# Patient Record
Sex: Female | Born: 1935 | Race: White | Hispanic: No | Smoking: Never smoker
Health system: Southern US, Community
[De-identification: ages and names within clinical notes are randomized; demographics above are authoritative.]

## PROBLEM LIST (undated history)

## (undated) DIAGNOSIS — M1990 Unspecified osteoarthritis, unspecified site: Secondary | ICD-10-CM

## (undated) DIAGNOSIS — I38 Endocarditis, valve unspecified: Secondary | ICD-10-CM

## (undated) DIAGNOSIS — D649 Anemia, unspecified: Secondary | ICD-10-CM

## (undated) DIAGNOSIS — Z9989 Dependence on other enabling machines and devices: Secondary | ICD-10-CM

## (undated) DIAGNOSIS — R42 Dizziness and giddiness: Secondary | ICD-10-CM

## (undated) DIAGNOSIS — I10 Essential (primary) hypertension: Secondary | ICD-10-CM

## (undated) DIAGNOSIS — I350 Nonrheumatic aortic (valve) stenosis: Secondary | ICD-10-CM

## (undated) DIAGNOSIS — Z972 Presence of dental prosthetic device (complete) (partial): Secondary | ICD-10-CM

## (undated) DIAGNOSIS — M109 Gout, unspecified: Secondary | ICD-10-CM

## (undated) DIAGNOSIS — I5030 Unspecified diastolic (congestive) heart failure: Secondary | ICD-10-CM

## (undated) DIAGNOSIS — Z923 Personal history of irradiation: Secondary | ICD-10-CM

## (undated) DIAGNOSIS — E785 Hyperlipidemia, unspecified: Secondary | ICD-10-CM

## (undated) DIAGNOSIS — M810 Age-related osteoporosis without current pathological fracture: Secondary | ICD-10-CM

## (undated) DIAGNOSIS — I4891 Unspecified atrial fibrillation: Secondary | ICD-10-CM

## (undated) DIAGNOSIS — C50919 Malignant neoplasm of unspecified site of unspecified female breast: Secondary | ICD-10-CM

## (undated) HISTORY — PX: JOINT REPLACEMENT: SHX530

## (undated) HISTORY — PX: BREAST SURGERY: SHX581

## (undated) HISTORY — PX: EYE SURGERY: SHX253

---

## 1987-04-26 HISTORY — PX: BREAST LUMPECTOMY: SHX2

## 1996-01-24 HISTORY — PX: JOINT REPLACEMENT: SHX530

## 2004-03-25 ENCOUNTER — Ambulatory Visit: Payer: Self-pay | Admitting: Internal Medicine

## 2005-05-09 ENCOUNTER — Ambulatory Visit: Payer: Self-pay | Admitting: Internal Medicine

## 2006-05-29 ENCOUNTER — Ambulatory Visit: Payer: Self-pay | Admitting: Internal Medicine

## 2007-06-04 ENCOUNTER — Ambulatory Visit: Payer: Self-pay | Admitting: Internal Medicine

## 2008-04-24 ENCOUNTER — Inpatient Hospital Stay: Payer: Self-pay | Admitting: Internal Medicine

## 2008-06-23 ENCOUNTER — Ambulatory Visit: Payer: Self-pay | Admitting: Internal Medicine

## 2009-02-11 ENCOUNTER — Ambulatory Visit: Payer: Self-pay | Admitting: Internal Medicine

## 2009-07-02 ENCOUNTER — Ambulatory Visit: Payer: Self-pay | Admitting: Internal Medicine

## 2010-04-28 ENCOUNTER — Emergency Department: Payer: Self-pay | Admitting: Emergency Medicine

## 2010-07-13 ENCOUNTER — Ambulatory Visit: Payer: Self-pay | Admitting: Internal Medicine

## 2011-08-23 ENCOUNTER — Ambulatory Visit: Payer: Self-pay | Admitting: Internal Medicine

## 2012-08-29 ENCOUNTER — Ambulatory Visit: Payer: Self-pay | Admitting: Internal Medicine

## 2013-08-30 ENCOUNTER — Ambulatory Visit: Payer: Self-pay | Admitting: Internal Medicine

## 2014-08-08 ENCOUNTER — Other Ambulatory Visit: Payer: Self-pay | Admitting: Internal Medicine

## 2014-08-08 DIAGNOSIS — Z1231 Encounter for screening mammogram for malignant neoplasm of breast: Secondary | ICD-10-CM

## 2014-09-02 ENCOUNTER — Ambulatory Visit
Admission: RE | Admit: 2014-09-02 | Discharge: 2014-09-02 | Disposition: A | Discharge status: Home / Self Care | Payer: Medicare Other | Source: Ambulatory Visit | Attending: Internal Medicine | Admitting: Internal Medicine

## 2014-09-02 DIAGNOSIS — Z853 Personal history of malignant neoplasm of breast: Secondary | ICD-10-CM | POA: Insufficient documentation

## 2014-09-02 DIAGNOSIS — Z9889 Other specified postprocedural states: Secondary | ICD-10-CM | POA: Diagnosis not present

## 2014-09-02 DIAGNOSIS — Z1231 Encounter for screening mammogram for malignant neoplasm of breast: Secondary | ICD-10-CM | POA: Diagnosis not present

## 2014-09-02 HISTORY — DX: Malignant neoplasm of unspecified site of unspecified female breast: C50.919

## 2015-07-13 ENCOUNTER — Other Ambulatory Visit: Payer: Self-pay | Admitting: Internal Medicine

## 2015-07-13 DIAGNOSIS — Z1231 Encounter for screening mammogram for malignant neoplasm of breast: Secondary | ICD-10-CM

## 2015-09-03 ENCOUNTER — Ambulatory Visit
Admission: RE | Admit: 2015-09-03 | Discharge: 2015-09-03 | Disposition: A | Discharge status: Home / Self Care | Payer: Medicare Other | Source: Ambulatory Visit | Attending: Internal Medicine | Admitting: Internal Medicine

## 2015-09-03 DIAGNOSIS — Z1231 Encounter for screening mammogram for malignant neoplasm of breast: Secondary | ICD-10-CM | POA: Diagnosis not present

## 2015-10-28 ENCOUNTER — Encounter: Payer: Self-pay | Admitting: *Deleted

## 2015-10-28 ENCOUNTER — Ambulatory Visit
Admission: EM | Admit: 2015-10-28 | Discharge: 2015-10-28 | Disposition: A | Discharge status: Home / Self Care | Payer: Medicare Other | Attending: Family Medicine | Admitting: Family Medicine

## 2015-10-28 DIAGNOSIS — E785 Hyperlipidemia, unspecified: Secondary | ICD-10-CM | POA: Insufficient documentation

## 2015-10-28 DIAGNOSIS — M810 Age-related osteoporosis without current pathological fracture: Secondary | ICD-10-CM | POA: Insufficient documentation

## 2015-10-28 DIAGNOSIS — S39012A Strain of muscle, fascia and tendon of lower back, initial encounter: Secondary | ICD-10-CM

## 2015-10-28 DIAGNOSIS — Z853 Personal history of malignant neoplasm of breast: Secondary | ICD-10-CM | POA: Insufficient documentation

## 2015-10-28 DIAGNOSIS — M109 Gout, unspecified: Secondary | ICD-10-CM | POA: Diagnosis not present

## 2015-10-28 DIAGNOSIS — M549 Dorsalgia, unspecified: Secondary | ICD-10-CM | POA: Insufficient documentation

## 2015-10-28 DIAGNOSIS — I10 Essential (primary) hypertension: Secondary | ICD-10-CM | POA: Insufficient documentation

## 2015-10-28 DIAGNOSIS — D649 Anemia, unspecified: Secondary | ICD-10-CM | POA: Diagnosis not present

## 2015-10-28 HISTORY — DX: Endocarditis, valve unspecified: I38

## 2015-10-28 HISTORY — DX: Anemia, unspecified: D64.9

## 2015-10-28 HISTORY — DX: Essential (primary) hypertension: I10

## 2015-10-28 HISTORY — DX: Unspecified osteoarthritis, unspecified site: M19.90

## 2015-10-28 HISTORY — DX: Age-related osteoporosis without current pathological fracture: M81.0

## 2015-10-28 HISTORY — DX: Hyperlipidemia, unspecified: E78.5

## 2015-10-28 HISTORY — DX: Gout, unspecified: M10.9

## 2015-10-28 LAB — URINALYSIS COMPLETE WITH MICROSCOPIC (ARMC ONLY)
Bacteria, UA: NONE SEEN
Bilirubin Urine: NEGATIVE
Glucose, UA: NEGATIVE mg/dL
HGB URINE DIPSTICK: NEGATIVE
Ketones, ur: NEGATIVE mg/dL
Nitrite: NEGATIVE
PH: 6 (ref 5.0–8.0)
PROTEIN: NEGATIVE mg/dL
RBC / HPF: NONE SEEN RBC/hpf (ref 0–5)
SPECIFIC GRAVITY, URINE: 1.015 (ref 1.005–1.030)

## 2015-10-28 NOTE — ED Provider Notes (Signed)
CSN: [id number]     Arrival date & time 10/28/15  1436 History   First MD Initiated Contact with Patient 10/28/15 1519     Chief Complaint  Patient presents with  . Back Pain   (Consider location/radiation/quality/duration/timing/severity/associated sxs/prior Treatment) [name] [name] presents today with symptoms of right mid back pain for the last week. Patient denies any trauma or injury. She denies any fall. She denies any history of back problems in the past. She thinks her pain is related to a muscular pain. She wanted to make sure her pain was not related to her gallbladder. She denies any abdominal pain, constipation, diarrhea, nausea, vomiting. She denies any chest pain, shortness of breath, urinary symptoms. She is here today with her daughter. Her daughter has not noticed any changes in her behavior or mental status recently. Patient states that the pain is noticeable when she gets up from a seated position or moves in certain directions.  Past Medical History  Diagnosis Date  . Breast cancer ([place])     lt breast ca 1989 lumpectomy  . Hypertension   . Gout   . Osteoporosis   . Anemia   . Arthritis   . Hyperlipemia   . Valvular heart disease    Past Surgical History  Procedure Laterality Date  . Eye surgery    . Joint replacement    . Breast surgery     Family History  Problem Relation Age of Onset  . Hypertension Mother   . Stroke Mother    Social History  Substance Use Topics  . Smoking status: Never Smoker   . Smokeless tobacco: Never Used  . Alcohol Use: No   OB History    No data available     Review of Systems: Negative except mentioned above.  Allergies  Review of patient's allergies indicates no known allergies.  Home Medications   Prior to Admission medications   Medication Sig Start Date End Date Taking? Authorizing Provider  aspirin 81 MG tablet Take 81 mg by mouth daily.   Yes Historical Provider, MD  calcium carbonate (OSCAL) 1500 (600 Ca) MG TABS  tablet Take 1,500 mg by mouth daily with breakfast.   Yes Historical Provider, MD  hydrALAZINE (APRESOLINE) 25 MG tablet Take 25 mg by mouth 2 (two) times daily.   Yes Historical Provider, MD  labetalol (NORMODYNE) 100 MG tablet Take 100 mg by mouth 2 (two) times daily.   Yes Historical Provider, MD  lisinopril-hydrochlorothiazide (PRINZIDE,ZESTORETIC) 20-12.5 MG tablet Take 1 tablet by mouth daily.   Yes Historical Provider, MD  meclizine (ANTIVERT) 25 MG tablet Take 25 mg by mouth 3 (three) times daily as needed for dizziness.   Yes Historical Provider, MD  Multiple Vitamin (MULTIVITAMIN) tablet Take 1 tablet by mouth.   Yes Historical Provider, MD  naproxen sodium (ANAPROX) 220 MG tablet Take 220 mg by mouth 2 (two) times daily with a meal.   Yes Historical Provider, MD   Meds Ordered and Administered this Visit  Medications - No data to display  BP 131/58 mmHg  Pulse 65  Temp(Src) 98.3 F (36.8 C) (Oral)  Resp 18  Ht 5\' 4"  (1.626 m)  Wt 160 lb (72.576 kg)  BMI 27.45 kg/m2  SpO2 97% No data found.   Physical Exam:  GENERAL: NAD, sitting in chair  HEENT: no pharyngeal erythema, no exudate RESP: CTA B CARD: RRR MSK: +kyphosis, no midline tenderness, mild tenderness to right lateral back area reproduced with palpation, extension and right/left side  bending  SKIN: no rash on the torso NEURO: CN II-XII grossly intact   ED Course  Procedures (including critical care time)  Labs Review Labs Reviewed  URINALYSIS COMPLETEWITH MICROSCOPIC ([place])    Imaging Review No results found.    MDM  A/P: Right mid back pain- appears to be likely musculoskeletal, I advised the patient to take Tylenol, NSAIDs to see if she has any difference in her pain. I've asked that she follow up with her primary care physician this week. Her daughter will monitor for any rash that develops in the area that could represent shingles. She will try ice/heat when necessary.     Paulina Fusi,  MD 10/28/15 [phone]

## 2015-10-28 NOTE — ED Notes (Signed)
Patient started having severe right mid back pain for two days. Patient states that she has been having back pain for two weeks. No history of back surgery, but some muscle back pain has occurred.

## 2016-08-22 ENCOUNTER — Other Ambulatory Visit: Payer: Self-pay | Admitting: Internal Medicine

## 2016-08-22 DIAGNOSIS — Z1231 Encounter for screening mammogram for malignant neoplasm of breast: Secondary | ICD-10-CM

## 2016-09-05 ENCOUNTER — Ambulatory Visit
Admission: RE | Admit: 2016-09-05 | Discharge: 2016-09-05 | Disposition: A | Discharge status: Home / Self Care | Payer: Medicare Other | Source: Ambulatory Visit | Attending: Internal Medicine | Admitting: Internal Medicine

## 2016-09-05 DIAGNOSIS — Z1231 Encounter for screening mammogram for malignant neoplasm of breast: Secondary | ICD-10-CM

## 2016-09-05 HISTORY — DX: Personal history of irradiation: Z92.3

## 2017-02-03 ENCOUNTER — Emergency Department: Payer: Medicare Other

## 2017-02-03 ENCOUNTER — Emergency Department
Admission: EM | Admit: 2017-02-03 | Discharge: 2017-02-03 | Disposition: A | Discharge status: Home / Self Care | Payer: Medicare Other | Attending: Emergency Medicine | Admitting: Emergency Medicine

## 2017-02-03 DIAGNOSIS — S51812A Laceration without foreign body of left forearm, initial encounter: Secondary | ICD-10-CM | POA: Insufficient documentation

## 2017-02-03 DIAGNOSIS — T148XXA Other injury of unspecified body region, initial encounter: Secondary | ICD-10-CM

## 2017-02-03 DIAGNOSIS — S41112A Laceration without foreign body of left upper arm, initial encounter: Secondary | ICD-10-CM

## 2017-02-03 DIAGNOSIS — Z79899 Other long term (current) drug therapy: Secondary | ICD-10-CM | POA: Insufficient documentation

## 2017-02-03 DIAGNOSIS — W19XXXA Unspecified fall, initial encounter: Secondary | ICD-10-CM | POA: Insufficient documentation

## 2017-02-03 DIAGNOSIS — Y92019 Unspecified place in single-family (private) house as the place of occurrence of the external cause: Secondary | ICD-10-CM | POA: Insufficient documentation

## 2017-02-03 DIAGNOSIS — Y939 Activity, unspecified: Secondary | ICD-10-CM | POA: Diagnosis not present

## 2017-02-03 DIAGNOSIS — S5012XA Contusion of left forearm, initial encounter: Secondary | ICD-10-CM | POA: Diagnosis not present

## 2017-02-03 DIAGNOSIS — Z853 Personal history of malignant neoplasm of breast: Secondary | ICD-10-CM | POA: Insufficient documentation

## 2017-02-03 DIAGNOSIS — Y999 Unspecified external cause status: Secondary | ICD-10-CM | POA: Insufficient documentation

## 2017-02-03 DIAGNOSIS — I10 Essential (primary) hypertension: Secondary | ICD-10-CM | POA: Insufficient documentation

## 2017-02-03 DIAGNOSIS — Z7982 Long term (current) use of aspirin: Secondary | ICD-10-CM | POA: Diagnosis not present

## 2017-02-03 DIAGNOSIS — D649 Anemia, unspecified: Secondary | ICD-10-CM | POA: Diagnosis not present

## 2017-02-03 DIAGNOSIS — R4182 Altered mental status, unspecified: Secondary | ICD-10-CM | POA: Diagnosis present

## 2017-02-03 LAB — URINALYSIS, COMPLETE (UACMP) WITH MICROSCOPIC
BACTERIA UA: NONE SEEN
Bilirubin Urine: NEGATIVE
Glucose, UA: NEGATIVE mg/dL
HGB URINE DIPSTICK: NEGATIVE
Ketones, ur: 5 mg/dL — AB
Leukocytes, UA: NEGATIVE
NITRITE: NEGATIVE
Protein, ur: NEGATIVE mg/dL
SPECIFIC GRAVITY, URINE: 1.009 (ref 1.005–1.030)
pH: 7 (ref 5.0–8.0)

## 2017-02-03 LAB — TYPE AND SCREEN
ABO/RH(D): O POS
ANTIBODY SCREEN: NEGATIVE

## 2017-02-03 LAB — COMPREHENSIVE METABOLIC PANEL
ALT: 22 U/L (ref 14–54)
ANION GAP: 10 (ref 5–15)
AST: 46 U/L — ABNORMAL HIGH (ref 15–41)
Albumin: 4.1 g/dL (ref 3.5–5.0)
Alkaline Phosphatase: 64 U/L (ref 38–126)
BUN: 17 mg/dL (ref 6–20)
CHLORIDE: 95 mmol/L — AB (ref 101–111)
CO2: 26 mmol/L (ref 22–32)
Calcium: 10.2 mg/dL (ref 8.9–10.3)
Creatinine, Ser: 0.8 mg/dL (ref 0.44–1.00)
GFR, EST NON AFRICAN AMERICAN: 59 mL/min — AB (ref >60)
Glucose, Bld: 116 mg/dL — ABNORMAL HIGH (ref 65–99)
POTASSIUM: 4.1 mmol/L (ref 3.5–5.1)
Sodium: 131 mmol/L — ABNORMAL LOW (ref 135–145)
Total Bilirubin: 0.6 mg/dL (ref 0.3–1.2)
Total Protein: 7.1 g/dL (ref 6.5–8.1)

## 2017-02-03 LAB — CBC
HEMATOCRIT: 32.8 % — AB (ref 35.0–47.0)
Hemoglobin: 11.3 g/dL — ABNORMAL LOW (ref 12.0–16.0)
MCH: 32.6 pg (ref 26.0–34.0)
MCHC: 34.3 g/dL (ref 32.0–36.0)
MCV: 95 fL (ref 80.0–100.0)
Platelets: 322 10*3/uL (ref 150–440)
RBC: 3.46 MIL/uL — ABNORMAL LOW (ref 3.80–5.20)
RDW: 12.6 % (ref 11.5–14.5)
WBC: 16.1 10*3/uL — AB (ref 3.6–11.0)

## 2017-02-03 LAB — PROTIME-INR
INR: 0.94
PROTHROMBIN TIME: 12.5 s (ref 11.4–15.2)

## 2017-02-03 LAB — CK
CK TOTAL: 761 U/L — AB (ref 38–234)
Total CK: 822 U/L — ABNORMAL HIGH (ref 38–234)

## 2017-02-03 LAB — RAPID HIV SCREEN (HIV 1/2 AB+AG)
HIV 1/2 Antibodies: NONREACTIVE
HIV-1 P24 ANTIGEN - HIV24: NONREACTIVE

## 2017-02-03 NOTE — ED Notes (Signed)
Bear hugger applied, family is at the bedside.

## 2017-02-03 NOTE — ED Provider Notes (Addendum)
[facility] Emergency Department Provider Note  Time seen: 10:46 AM  I have reviewed the triage vital signs and the nursing notes.   HISTORY  Chief Complaint Fall and Altered Mental Status    HPI [name] [name] is a 83 y.o. female With a past medical history of anemia, arthritis, hypertension, hyperlipidemia, presents the emergency department after a fall with laceration. According to family 2 days ago the patient was started on prednisone for a skin lesion to her right upper extremity. They state last night she did appear mildly confused. They did not think much of it however. Today they found the patient on the ground where she had apparently fallen around 9 PM yesterday. Patient was covered in blood from a laceration to her left upper extremity. Here the patient is alert, oriented besides time (states 1918 instead of 2018).  patient does take 81 mg aspirin. There unsure if the patient has been taking her medication properly however. Upon arrival overall patient appears well, lying in bed, in no distress. Patient does have a approximate 4 x 4 cm hematoma to the left forearm with an overlying laceration/skin rupture and bleeding.  Past Medical History:  Diagnosis Date  . Anemia   . Arthritis   . Breast cancer ([place])    lt breast ca 1989 lumpectomy  . Gout   . Hyperlipemia   . Hypertension   . Osteoporosis   . Personal history of radiation therapy   . Valvular heart disease     There are no active problems to display for this patient.   Past Surgical History:  Procedure Laterality Date  . BREAST LUMPECTOMY Left 1989  . BREAST SURGERY    . EYE SURGERY    . JOINT REPLACEMENT      Prior to Admission medications   Medication Sig Start Date End Date Taking? Authorizing Provider  aspirin 81 MG tablet Take 81 mg by mouth daily.    [provider]  calcium carbonate (OSCAL) 1500 (600 Ca) MG TABS tablet Take 1,500 mg by mouth daily with breakfast.     [provider]  hydrALAZINE (APRESOLINE) 25 MG tablet Take 25 mg by mouth 2 (two) times daily.    [provider]  labetalol (NORMODYNE) 100 MG tablet Take 100 mg by mouth 2 (two) times daily.    [provider]  lisinopril-hydrochlorothiazide (PRINZIDE,ZESTORETIC) 20-12.5 MG tablet Take 1 tablet by mouth daily.    [provider]  meclizine (ANTIVERT) 25 MG tablet Take 25 mg by mouth 3 (three) times daily as needed for dizziness.    [provider]  Multiple Vitamin (MULTIVITAMIN) tablet Take 1 tablet by mouth.    [provider]  naproxen sodium (ANAPROX) 220 MG tablet Take 220 mg by mouth 2 (two) times daily with a meal.    [provider]    No Known Allergies  Family History  Problem Relation Age of Onset  . Hypertension Mother   . Stroke Mother   . Breast cancer Neg Hx     Social History Social History  Substance Use Topics  . Smoking status: Never Smoker  . Smokeless tobacco: Never Used  . Alcohol use No    Review of Systems Constitutional: positive for fall, unclear if LOC. Cardiovascular: Negative for chest pain. Respiratory: Negative for shortness of breath. Gastrointestinal: Negative for abdominal pain, vomiting Skin: hematoma with laceration to left forearm Neurological: Negative for headaches, focal weakness or numbness. All other ROS negative  ____________________________________________   PHYSICAL EXAM:  VITAL SIGNS: ED Triage Vitals  Enc Vitals Group     BP 02/03/17 1010 (!) 187/108     Pulse Rate 02/03/17 1010 78     Resp 02/03/17 1010 18     Temp 02/03/17 1046 (!) 95.3 F (35.2 C)     Temp Source 02/03/17 1010 Rectal     SpO2 02/03/17 1010 98 %     Weight 02/03/17 1011 150 lb (68 kg)     Height 02/03/17 1011 5\' 5"  (1.651 m)     Head Circumference --      Peak Flow --      Pain Score 02/03/17 1009 8     Pain Loc --      Pain Edu? --      Excl. in [place]? --     Constitutional:  Alert and oriented. Well appearing and in no distress. Eyes: Normal exam ENT   Head: Normocephalic and atraumatic.   Mouth/Throat: Mucous membranes are moist. Cardiovascular: Normal rate, regular rhythm. No murmur Respiratory: Normal respiratory effort without tachypnea nor retractions. Breath sounds are clear  Gastrointestinal: Soft and nontender. No distention.  Musculoskeletal: hematoma with laceration to left upper extremity, otherwise normal. Good range of motion all extremities without pain. Neurologic:  Normal speech and language. No gross focal neurologic deficits Skin:  4 x 4 centimeter hematoma to left upper extremity with approximate 3 cm laceration/rupture overlying. Mild amount of bleeding. Psychiatric: Mood and affect are normal.  ____________________________________________    EKG  EKG reviewed and interpreted by myself shows normal sinus rhythm at 69 bpm with a widened QRS, left axis deviation, largely normal intervals with no concerning ST changes.  ____________________________________________    RADIOLOGY  IMPRESSION: Mild soft tissue swelling/ hematoma overlying the left parietal bone and left lateral zygoma.  No evidence of maxillofacial fracture.  No evidence of acute intracranial abnormality. Small vessel ischemic changes.  No evidence of traumatic injury to the cervical spine. Mild multilevel degenerative changes.  ____________________________________________   INITIAL IMPRESSION / ASSESSMENT AND PLAN / ED COURSE  Pertinent labs & imaging results that were available during my care of the patient were reviewed by me and considered in my medical decision making (see chart for details).  patient presents to the emergency department after a fall last night, unable to get up off the ground over night. Differential this time would include mechanical fall, electrolyte abnormality, infectious etiology, prednisone induced confusion, rhabdomyolysis,  intracranial abnormalities. We will obtain labs including a CK, urinalysis. We will obtain a head CT and continue to closely monitor the patient in the emergency department. Patient is noted to be mildly hypothermic we will place on a bear hugger. The patient's hematoma is approximately 4 x 4 centimeters, well-circumscribed to the left forearm with approximately 3 cm laceration overlying the top of the hematoma. Surgicel was placed over the laceration and [place] and was placed on top as a pressure dressing. Hemostasis has been achieved.  patient's labs show a moderate leukocytosis, mild to moderate creatinine kinase elevation. We will recheck at 2 hour mark.  CT scans are negative for acute abnormality besides and external hematoma.discussed findings with patient. Change the patient's bandage, bleeding remains controlled.  EMS worker was exposed to blood of the patient during the transportation. Paramedic got patient's blood into her mouth. Immediately washed her mouth. Denies any open sores. I reviewed the patient's records, discussed with the paramedic but I believe it is a very low risk  exposure, and she opted not to pursue any further prophylaxis. We will however obtain source exposure labs including HIV hepatitis B and C.  creatinine kinase is declining. I believe the patient is safe for discharge home with PCP follow-up. Daughter is here and is agreeable to plan.I discussed with the daughter monitoring the patient closely at home and discontinuing prednisone. I also discussed wound care changing the dressing once daily.  ____________________________________________   FINAL CLINICAL IMPRESSION(S) / ED DIAGNOSES  fall Hematoma  laceration    Harvest Dark, MD 02/03/17 1408    Harvest Dark, MD 02/03/17 1408

## 2017-02-03 NOTE — Discharge Instructions (Signed)
you have been seen in the emergency department after a fall. You have suffered a hematoma with cut to the left arm. Please keep this covered with a dressing and change this dressing once daily. Please follow-up with your doctor for recheck in the next 2-3 days. Return to the emergency department for any further falls, any significant bleeding, or any other symptom personally concerning to yourself.

## 2017-02-03 NOTE — ED Triage Notes (Addendum)
Pt comes into the ED via EMS from home with c/o AMS with unknown fall. Pt lives alone and normally is a/ox4. Granddaughter states the pt was slightly confused last night and when she checked on her today found her more altered and covered in blood from a wound on the left FA, states she has been on abx and predisone for infection of the right arm. Pt has a hematoma to the left side of face, but is not aware of falling and is a poor historian at present. Pt is alert and has clear speech.. Pt has large hematoma of the left FA with a skin tear.

## 2017-02-17 ENCOUNTER — Encounter: Payer: Medicare Other | Attending: Physician Assistant | Admitting: Physician Assistant

## 2017-02-17 DIAGNOSIS — Z923 Personal history of irradiation: Secondary | ICD-10-CM | POA: Diagnosis not present

## 2017-02-17 DIAGNOSIS — Z853 Personal history of malignant neoplasm of breast: Secondary | ICD-10-CM | POA: Diagnosis not present

## 2017-02-17 DIAGNOSIS — W19XXXA Unspecified fall, initial encounter: Secondary | ICD-10-CM | POA: Diagnosis not present

## 2017-02-17 DIAGNOSIS — M1990 Unspecified osteoarthritis, unspecified site: Secondary | ICD-10-CM | POA: Insufficient documentation

## 2017-02-17 DIAGNOSIS — I10 Essential (primary) hypertension: Secondary | ICD-10-CM | POA: Diagnosis not present

## 2017-02-17 DIAGNOSIS — E785 Hyperlipidemia, unspecified: Secondary | ICD-10-CM | POA: Insufficient documentation

## 2017-02-17 DIAGNOSIS — S51802A Unspecified open wound of left forearm, initial encounter: Secondary | ICD-10-CM | POA: Insufficient documentation
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
## 2017-02-20 NOTE — Progress Notes (Signed)
[name], [name] ([id number]) Visit Report for 02/17/2017 Abuse/Suicide Risk Screen Details Patient Name: [name] [name], [name] Date of Service: 02/17/2017 8:00 AM Medical Record Number: [id number] Patient Account Number: [id number] Date of Birth/Sex: 05/11/1916 (83 y.o. Female) Treating RN: Montey Hora Primary Care Lambros Cerro: Raechel Ache, DAVID Other Clinician: Referring Aina Rossbach: Raechel Ache, DAVID Treating Lerline Valdivia/Extender: Melburn Hake, HOYT Weeks in Treatment: 0 Abuse/Suicide Risk Screen Items Answer ABUSE/SUICIDE RISK SCREEN: Has anyone close to you tried to hurt or harm you recentlyo No Do you feel uncomfortable with anyone in your familyo No Has anyone forced you do things that you didnot want to doo No Do you have any thoughts of harming yourselfo No Patient displays signs or symptoms of abuse and/or neglect. No Electronic Signature(s) Signed: 02/17/2017 4:32:17 PM By: Montey Hora Entered By: Montey Hora on 02/17/2017 08:27:10 Weyandt, Judyann P. ([id number]) -------------------------------------------------------------------------------- Activities of Daily Living Details Patient Name: [name], [name] Date of Service: 02/17/2017 8:00 AM Medical Record Number: [id number] Patient Account Number: [id number] Date of Birth/Sex: 08-18-1916 (83 y.o. Female) Treating RN: Montey Hora Primary Care Jahkeem Kurka: Raechel Ache, DAVID Other Clinician: Referring Calum Cormier: Raechel Ache, DAVID Treating Oceana Walthall/Extender: Melburn Hake, HOYT Weeks in Treatment: 0 Activities of Daily Living Items Answer Activities of Daily Living (Please select one for each item) Drive Automobile Not Able Take Medications Need Assistance Use Telephone Completely Able Care for Appearance Completely Able Use Toilet Completely Able Bath / Shower Completely Able Dress Self Completely Able Feed Self Completely Able Walk Completely Able Get In / Out Bed Completely [place] Need Assistance Shop for Self Need Assistance Electronic Signature(s) Signed: 02/17/2017 4:32:17 PM By: Montey Hora Entered By: Montey Hora on 02/17/2017 08:27:45 Gilliand, Sina P. ([id number]) -------------------------------------------------------------------------------- Education Assessment Details Patient Name: [name], [name] Date of Service: 02/17/2017 8:00 AM Medical Record Number: [id number] Patient Account Number: [id number] Date of Birth/Sex: May 21, 1916 (83 y.o. Female) Treating RN: Montey Hora Primary Care Khloi Rawl: Raechel Ache, DAVID Other Clinician: Referring Hisayo Delossantos: Raechel Ache, DAVID Treating Shellye Zandi/Extender: Melburn Hake, HOYT Weeks in Treatment: 0 Primary Learner Assessed: Caregiver Reason Patient is not Primary Learner: wound location Learning Preferences/Education Level/Primary Language Learning Preference: Explanation, Demonstration Highest Education Level: High School Preferred Language: English Cognitive Barrier Assessment/Beliefs Language Barrier: No Translator Needed: No Memory Deficit: No Emotional Barrier: No Cultural/Religious Beliefs Affecting Medical Care: No Physical Barrier Assessment Impaired Vision: No Impaired Hearing: No Decreased Hand dexterity: No Knowledge/Comprehension Assessment Knowledge Level: Medium Comprehension Level: Medium Ability to understand written Medium instructions: Ability to understand verbal Medium instructions: Motivation Assessment Anxiety Level: Calm Cooperation: Cooperative Education Importance: Acknowledges Need Interest in Health Problems: Asks Questions Perception: Coherent Willingness to Engage in Self- Medium Management Activities: Readiness to Engage in Self- Medium Management Activities: Electronic Signature(s) Signed: 02/17/2017 4:32:17 PM By: Montey Hora Entered By: Montey Hora on 02/17/2017 08:28:13 Commins, Kaitlan P.  ([id number]) -------------------------------------------------------------------------------- Fall Risk Assessment Details Patient Name: Cockerill, [name] Date of Service: 02/17/2017 8:00 AM Medical Record Number: [id number] Patient Account Number: [id number] Date of Birth/Sex: Aug 20, 1916 (83 y.o. Female) Treating RN: Montey Hora Primary Care Kloe Oates: Raechel Ache, DAVID Other Clinician: Referring Kai Calico: Raechel Ache, DAVID Treating Jaslynn Thome/Extender: Melburn Hake, HOYT Weeks in Treatment: 0 Fall Risk Assessment Items Have you had 2 or more falls in the last 12 monthso 0 Yes Have you had any fall that resulted in injury in the last 12 monthso 0 Yes FALL RISK ASSESSMENT: History of falling - immediate or within 3 months 25 Yes Secondary diagnosis 0 No Ambulatory aid None/bed  rest/wheelchair/nurse 0 No Crutches/cane/walker 15 Yes Furniture 0 No IV Access/Saline Lock 0 No Gait/Training Normal/bed rest/immobile 0 No Weak 10 Yes Impaired 0 No Mental Status Oriented to own ability 0 Yes Electronic Signature(s) Signed: 02/17/2017 4:32:17 PM By: Montey Hora Entered By: Montey Hora on 02/17/2017 08:28:26 Lodge, Vibha P. ([id number]) -------------------------------------------------------------------------------- Nutrition Risk Assessment Details Patient Name: [name], [name] Date of Service: 02/17/2017 8:00 AM Medical Record Number: [id number] Patient Account Number: [id number] Date of Birth/Sex: [birth year] (83 y.o. Female) Treating RN: Montey Hora Primary Care Analya Louissaint: Raechel Ache, DAVID Other Clinician: Referring Erasmo Vertz: Raechel Ache, DAVID Treating Aslan Montagna/Extender: STONE III, HOYT Weeks in Treatment: 0 Height (in): Weight (lbs): Body Mass Index (BMI): Nutrition Risk Assessment Items NUTRITION RISK SCREEN: I have an illness or condition that made me change the kind and/or amount of 0 No food I eat I eat fewer than two meals per day 0 No I eat few fruits and vegetables, or milk  products 0 No I have three or more drinks of beer, liquor or wine almost every day 0 No I have tooth or mouth problems that make it hard for me to eat 0 No I don't always have enough money to buy the food I need 0 No I eat alone most of the time 0 No I take three or more different prescribed or over-the-counter drugs a day 1 Yes Without wanting to, I have lost or gained 10 pounds in the last six months 0 No I am not always physically able to shop, cook and/or feed myself 0 No Nutrition Protocols Good Risk Protocol 0 No interventions needed Moderate Risk Protocol Electronic Signature(s) Signed: 02/17/2017 4:32:17 PM By: Montey Hora Entered By: Montey Hora on 02/17/2017 08:28:33

## 2017-02-20 NOTE — Progress Notes (Signed)
KRITI, KATAYAMA ([id number]) Visit Report for 02/17/2017 Allergy List Details Patient Name: [name], [name] [name]. Date of Service: 02/17/2017 8:00 AM Medical Record Number: [id number] Patient Account Number: [id number] Date of Birth/Sex: [birth year] (83 y.o. Female) Treating RN: Montey Hora Primary Care Granville Whitefield: Raechel Ache, DAVID Other Clinician: Referring Hadyn Blanck: Raechel Ache, DAVID Treating Kol Consuegra/Extender: STONE III, HOYT Weeks in Treatment: 0 Allergies Active Allergies No Known Allergies Allergy Notes Electronic Signature(s) Signed: 02/17/2017 4:32:17 PM By: Montey Hora Entered By: Montey Hora on 02/17/2017 08:27:00 [name] [name], [name] [name]. ([id number]) -------------------------------------------------------------------------------- Arrival Information Details Patient Name: [name], [name] [name]. Date of Service: 02/17/2017 8:00 AM Medical Record Number: [id number] Patient Account Number: [id number] Date of Birth/Sex: 04/02/1917 (83 y.o. Female) Treating RN: Montey Hora Primary Care Omarri Eich: Raechel Ache, DAVID Other Clinician: Referring Henli Hey: Raechel Ache, DAVID Treating Jaimy Kliethermes/Extender: Melburn Hake, HOYT Weeks in Treatment: 0 Visit Information Patient Arrived: Cane Arrival Time: 08:24 Accompanied By: dtr Transfer Assistance: None Patient Identification Verified: Yes Secondary Verification Process Completed: Yes Electronic Signature(s) Signed: 02/17/2017 4:32:17 PM By: Montey Hora Entered By: Montey Hora on 02/17/2017 08:26:41 [name] [name], [name] [name]. ([id number]) -------------------------------------------------------------------------------- Clinic Level of Care Assessment Details Patient Name: [name], [name] [name]. Date of Service: 02/17/2017 8:00 AM Medical Record Number: [id number] Patient Account Number: [id number] Date of Birth/Sex: 1916/09/20 (83 y.o. Female) Treating RN: Montey Hora Primary Care Markeith Jue: Raechel Ache, DAVID Other Clinician: Referring Mikias Lanz: Raechel Ache, DAVID Treating  Yuvia Plant/Extender: Melburn Hake, HOYT Weeks in Treatment: 0 Clinic Level of Care Assessment Items TOOL 1 Quantity Score []  - Use when EandM and Procedure is performed on INITIAL visit 0 ASSESSMENTS - Nursing Assessment / Reassessment X - General Physical Exam (combine w/ comprehensive assessment (listed just below) when 1 20 performed on new pt. evals) X- 1 25 Comprehensive Assessment (HX, ROS, Risk Assessments, Wounds Hx, etc.) ASSESSMENTS - Wound and Skin Assessment / Reassessment []  - Dermatologic / Skin Assessment (not related to wound area) 0 ASSESSMENTS - Ostomy and/or Continence Assessment and Care []  - Incontinence Assessment and Management 0 []  - 0 Ostomy Care Assessment and Management (repouching, etc.) PROCESS - Coordination of Care X - Simple Patient / Family Education for ongoing care 1 15 []  - 0 Complex (extensive) Patient / Family Education for ongoing care X- 1 10 Staff obtains Programmer, systems, Records, Test Results / Process Orders []  - 0 Staff telephones HHA, Nursing Homes / Clarify orders / etc []  - 0 Routine Transfer to another Facility (non-emergent condition) []  - 0 Routine Hospital Admission (non-emergent condition) X- 1 15 New Admissions / Biomedical engineer / Ordering NPWT, Apligraf, etc. []  - 0 Emergency Hospital Admission (emergent condition) PROCESS - Special Needs []  - Pediatric / Minor Patient Management 0 []  - 0 Isolation Patient Management []  - 0 Hearing / Language / Visual special needs []  - 0 Assessment of Community assistance (transportation, D/C planning, etc.) []  - 0 Additional assistance / Altered mentation []  - 0 Support Surface(s) Assessment (bed, cushion, seat, etc.) [name] [name], [name] [name]. ([id number]) INTERVENTIONS - Miscellaneous []  - External ear exam 0 []  - 0 Patient Transfer (multiple staff / Civil Service fast streamer / Similar devices) []  - 0 Simple Staple / Suture removal (25 or less) []  - 0 Complex Staple / Suture removal (26 or more) []   - 0 Hypo/Hyperglycemic Management (do not check if billed separately) []  - 0 Ankle / Brachial Index (ABI) - do not check if billed separately Has the patient been seen at the hospital within the last three years: Yes Total Score: 85 Level Of Care: New/Established - Level 3 Electronic  Signature(s) Signed: 02/17/2017 4:32:17 PM By: Montey Hora Entered By: Montey Hora on 02/17/2017 10:30:53 [name] [name], [name] [name]. ([id number]) -------------------------------------------------------------------------------- Encounter Discharge Information Details Patient Name: [name], [name] [name]. Date of Service: 02/17/2017 8:00 AM Medical Record Number: [id number] Patient Account Number: [id number] Date of Birth/Sex: 11-14-1916 (83 y.o. Female) Treating RN: Montey Hora Primary Care Dudley Mages: Raechel Ache, DAVID Other Clinician: Referring Errik Mitchelle: Raechel Ache, DAVID Treating Avishai Reihl/Extender: Melburn Hake, HOYT Weeks in Treatment: 0 Encounter Discharge Information Items Discharge Pain Level: 0 Discharge Condition: Stable Ambulatory Status: Cane Discharge Destination: Home Transportation: Private Auto Accompanied By: dtr Schedule Follow-up Appointment: Yes Medication Reconciliation completed and No provided to Patient/Care Macklyn Glandon: Provided on Clinical Summary of Care: 02/17/2017 Form Type Recipient Paper Patient IM Electronic Signature(s) Signed: 02/20/2017 11:56:58 AM By: Ruthine Dose Entered By: Ruthine Dose on 02/17/2017 09:21:07 [name] [name], [name] [name]. ([id number]) -------------------------------------------------------------------------------- Multi Wound Chart Details Patient Name: [name], [name] [name]. Date of Service: 02/17/2017 8:00 AM Medical Record Number: [id number] Patient Account Number: [id number] Date of Birth/Sex: 1916-11-17 (83 y.o. Female) Treating RN: Montey Hora Primary Care Lacara Dunsworth: Raechel Ache, DAVID Other Clinician: Referring Kairon Shock: Raechel Ache, DAVID Treating Abb Gobert/Extender: Melburn Hake,  HOYT Weeks in Treatment: 0 Vital Signs Height(in): 63 Pulse(bpm): 30 Weight(lbs): 149 Blood Pressure(mmHg): 136/58 Body Mass Index(BMI): 26 Temperature(F): 97.6 Respiratory Rate 16 (breaths/min): Photos: [1:No Photos] [N/A:N/A] Wound Location: [1:Left Forearm] [N/A:N/A] Wounding Event: [1:Trauma] [N/A:N/A] Primary Etiology: [1:Skin Tear] [N/A:N/A] Comorbid History: [1:Cataracts, Anemia, Hypertension, Osteoarthritis, Received Radiation] [N/A:N/A] Date Acquired: [1:02/03/2017] [N/A:N/A] Weeks of Treatment: [1:0] [N/A:N/A] Wound Status: [1:Open] [N/A:N/A] Measurements L x W x D [1:6.2x3.1x0.1] [N/A:N/A] (cm) Area (cm) : [1:15.095] [N/A:N/A] Volume (cm) : [1:1.51] [N/A:N/A] Classification: [1:Full Thickness With Exposed Support Structures] [N/A:N/A] Exudate Amount: [1:Large] [N/A:N/A] Exudate Type: [1:Sanguinous] [N/A:N/A] Exudate Color: [1:red] [N/A:N/A] Wound Margin: [1:Flat and Intact] [N/A:N/A] Granulation Amount: [1:None Present (0%)] [N/A:N/A] Necrotic Amount: [1:Large (67-100%)] [N/A:N/A] Necrotic Tissue: [1:Eschar] [N/A:N/A] Exposed Structures: [1:Fat Layer (Subcutaneous Tissue) Exposed: Yes Fascia: No Tendon: No Muscle: No Joint: No Bone: No] [N/A:N/A] Epithelialization: [1:None] [N/A:N/A] Periwound Skin Texture: [1:Excoriation: No Induration: No Callus: No Crepitus: No Rash: No Scarring: No] [N/A:N/A] Periwound Skin Moisture: Maceration: No N/A N/A Dry/Scaly: No Periwound Skin Color: Atrophie Blanche: No N/A N/A Cyanosis: No Ecchymosis: No Erythema: No Hemosiderin Staining: No Mottled: No Pallor: No Rubor: No Temperature: No Abnormality N/A N/A Tenderness on Palpation: Yes N/A N/A Wound Preparation: Ulcer Cleansing: N/A N/A Rinsed/Irrigated with Saline Topical Anesthetic Applied: Other: lidocaine 4% Treatment Notes Electronic Signature(s) Signed: 02/17/2017 4:32:17 PM By: Montey Hora Entered By: Montey Hora on 02/17/2017 08:55:08 [name] [name],  [name] [name]. ([id number]) -------------------------------------------------------------------------------- Multi-Disciplinary Care Plan Details Patient Name: Casique, Orpha [name]. Date of Service: 02/17/2017 8:00 AM Medical Record Number: [id number] Patient Account Number: [id number] Date of Birth/Sex: 03-02-1917 (83 y.o. Female) Treating RN: Montey Hora Primary Care Dann Galicia: Raechel Ache, DAVID Other Clinician: Referring Jimmy Plessinger: Raechel Ache, DAVID Treating Jeymi Hepp/Extender: Melburn Hake, HOYT Weeks in Treatment: 0 Active Inactive ` Abuse / Safety / Falls / Self Care Management Nursing Diagnoses: Impaired physical mobility Potential for falls Goals: Patient will not experience any injury related to falls Date Initiated: 02/17/2017 Target Resolution Date: 04/29/2017 Goal Status: Active Interventions: Assess fall risk on admission and as needed Notes: ` Orientation to the Wound Care Program Nursing Diagnoses: Knowledge deficit related to the wound healing center program Goals: Patient/caregiver will verbalize understanding of the [place] Date Initiated: 02/17/2017 Target Resolution Date: 04/29/2017 Goal Status: Active Interventions: Provide education on orientation to the wound center Notes: ` Wound/Skin Impairment Nursing Diagnoses: Impaired tissue  integrity Goals: Ulcer/skin breakdown will heal within 14 weeks Date Initiated: 02/17/2017 Target Resolution Date: 04/29/2017 Goal Status: Active [name] [name], [name] [name]. ([id number]) Interventions: Assess patient/caregiver ability to obtain necessary supplies Assess patient/caregiver ability to perform ulcer/skin care regimen upon admission and as needed Assess ulceration(s) every visit Notes: Electronic Signature(s) Signed: 02/17/2017 4:32:17 PM By: Montey Hora Entered By: Montey Hora on 02/17/2017 08:54:41 [name] [name], [name] [name].  ([id number]) -------------------------------------------------------------------------------- Pain Assessment Details Patient Name: Slagel, Kahealani [name]. Date of Service: 02/17/2017 8:00 AM Medical Record Number: [id number] Patient Account Number: [id number] Date of Birth/Sex: 04-04-1917 (83 y.o. Female) Treating RN: Montey Hora Primary Care Larell Baney: Raechel Ache, DAVID Other Clinician: Referring Selby Slovacek: Raechel Ache, DAVID Treating Kathrynn Backstrom/Extender: Melburn Hake, HOYT Weeks in Treatment: 0 Active Problems Location of Pain Severity and Description of Pain Patient Has Paino No Site Locations Pain Management and Medication Current Pain Management: Notes Topical or injectable lidocaine is offered to patient for acute pain when surgical debridement is performed. If needed, Patient is instructed to use over the counter pain medication for the following 24-48 hours after debridement. Wound care MDs do not prescribed pain medications. Patient has chronic pain or uncontrolled pain. Patient has been instructed to make an appointment with their Primary Care Physician for pain management. Electronic Signature(s) Signed: 02/17/2017 4:32:17 PM By: Montey Hora Entered By: Montey Hora on 02/17/2017 08:26:48 [name] [name], [name] [name] ([id number]) -------------------------------------------------------------------------------- Patient/Caregiver Education Details Patient Name: [name] [name], [name] [name]. Date of Service: 02/17/2017 8:00 AM Medical Record Number: [id number] Patient Account Number: [id number] Date of Birth/Gender: 1917-04-19 (83 y.o. Female) Treating RN: Montey Hora Primary Care Physician: Raechel Ache, DAVID Other Clinician: Referring Physician: Raechel Ache, DAVID Treating Physician/Extender: Sharalyn Ink in Treatment: 0 Education Assessment Education Provided To: Patient and Caregiver Education Topics Provided Wound/Skin Impairment: Handouts: Other: wound care as ordered Methods: Demonstration,  Explain/Verbal Responses: State content correctly Electronic Signature(s) Signed: 02/17/2017 4:32:17 PM By: Montey Hora Entered By: Montey Hora on 02/17/2017 08:55:46 [name] [name], [name] [name]. ([id number]) -------------------------------------------------------------------------------- Wound Assessment Details Patient Name: Pitcher, Keliah [name]. Date of Service: 02/17/2017 8:00 AM Medical Record Number: [id number] Patient Account Number: [id number] Date of Birth/Sex: [birth year] (83 y.o. Female) Treating RN: Montey Hora Primary Care Caitrin Pendergraph: Raechel Ache, DAVID Other Clinician: Referring Jeison Delpilar: Raechel Ache, DAVID Treating Christino Mcglinchey/Extender: Melburn Hake, HOYT Weeks in Treatment: 0 Wound Status Wound Number: 1 Primary Skin Tear Etiology: Wound Location: Left Forearm Wound Open Wounding Event: Trauma Status: Date Acquired: 02/03/2017 Comorbid Cataracts, Anemia, Hypertension, Weeks Of Treatment: 0 History: Osteoarthritis, Received Radiation Clustered Wound: No Photos Photo Uploaded By: Montey Hora on 02/17/2017 13:37:24 Wound Measurements Length: (cm) 6.2 Width: (cm) 3.1 Depth: (cm) 0.1 Area: (cm) 15.095 Volume: (cm) 1.51 % Reduction in Area: % Reduction in Volume: Epithelialization: None Tunneling: No Undermining: No Wound Description Full Thickness With Exposed Support Classification: Structures Wound Margin: Flat and Intact Exudate Large Amount: Exudate Type: Sanguinous Exudate Color: red Foul Odor After Cleansing: No Slough/Fibrino Yes Wound Bed Granulation Amount: None Present (0%) Exposed Structure Necrotic Amount: Large (67-100%) Fascia Exposed: No Necrotic Quality: Eschar Fat Layer (Subcutaneous Tissue) Exposed: Yes Tendon Exposed: No Muscle Exposed: No Joint Exposed: No Bone Exposed: No Jacko, Emilyn [name]. ([id number]) Periwound Skin Texture Texture Color No Abnormalities Noted: No No Abnormalities Noted: No Callus: No Atrophie Blanche: No Crepitus:  No Cyanosis: No Excoriation: No Ecchymosis: No Induration: No Erythema: No Rash: No Hemosiderin Staining: No Scarring: No Mottled: No Pallor: No Moisture Rubor: No No Abnormalities Noted: No Dry / Scaly: No Temperature / Pain Maceration: No Temperature: No Abnormality Tenderness on  Palpation: Yes Wound Preparation Ulcer Cleansing: Rinsed/Irrigated with Saline Topical Anesthetic Applied: Other: lidocaine 4%, Treatment Notes Wound #1 (Left Forearm) 1. Cleansed with: Clean wound with Normal Saline 2. Anesthetic Topical Lidocaine 4% cream to wound bed prior to debridement Hurricaine Topical Anesthetic Spray 4. Dressing Applied: Contact layer Other dressing (specify in notes) 5. Secondary Dressing Applied ABD and Kerlix/Conform 7. Secured with Tape Notes silvercel, coban Electronic Signature(s) Signed: 02/17/2017 4:32:17 PM By: Montey Hora Entered By: Montey Hora on 02/17/2017 08:48:28 Schreffler, Mykaela [name]. ([id number]) -------------------------------------------------------------------------------- Vitals Details Patient Name: Cahn, Aspin [name]. Date of Service: 02/17/2017 8:00 AM Medical Record Number: [id number] Patient Account Number: [id number] Date of Birth/Sex: 20-Jan-1917 (83 y.o. Female) Treating RN: Montey Hora Primary Care Codee Bloodworth: Raechel Ache, DAVID Other Clinician: Referring Maudry Zeidan: Raechel Ache, DAVID Treating Genavive Kubicki/Extender: Melburn Hake, HOYT Weeks in Treatment: 0 Vital Signs Time Taken: 08:35 Temperature (F): 97.6 Height (in): 63 Pulse (bpm): 68 Source: Measured Respiratory Rate (breaths/min): 16 Weight (lbs): 149 Blood Pressure (mmHg): 136/58 Source: Measured Reference Range: 80 - 120 mg / dl Body Mass Index (BMI): 26.4 Electronic Signature(s) Signed: 02/17/2017 4:32:17 PM By: Montey Hora Entered By: Montey Hora on 02/17/2017 08:37:14

## 2017-02-24 ENCOUNTER — Ambulatory Visit: Payer: Medicare Other | Admitting: Surgery

## 2017-02-27 ENCOUNTER — Encounter: Payer: Medicare Other | Attending: Surgery | Admitting: Surgery

## 2017-02-27 DIAGNOSIS — I10 Essential (primary) hypertension: Secondary | ICD-10-CM | POA: Diagnosis not present

## 2017-02-27 DIAGNOSIS — Z853 Personal history of malignant neoplasm of breast: Secondary | ICD-10-CM | POA: Diagnosis not present

## 2017-02-27 DIAGNOSIS — S51802A Unspecified open wound of left forearm, initial encounter: Secondary | ICD-10-CM | POA: Insufficient documentation

## 2017-02-27 DIAGNOSIS — Z923 Personal history of irradiation: Secondary | ICD-10-CM | POA: Diagnosis not present

## 2017-02-27 DIAGNOSIS — X58XXXA Exposure to other specified factors, initial encounter: Secondary | ICD-10-CM | POA: Insufficient documentation

## 2017-02-27 DIAGNOSIS — M1990 Unspecified osteoarthritis, unspecified site: Secondary | ICD-10-CM | POA: Insufficient documentation

## 2017-02-27 DIAGNOSIS — E785 Hyperlipidemia, unspecified: Secondary | ICD-10-CM | POA: Insufficient documentation
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
## 2017-02-28 NOTE — Progress Notes (Signed)
QUANNA, WITTKE ([id number]) Visit Report for 02/27/2017 Arrival Information Details Patient Name: [name] [name], [name] Date of Service: 02/27/2017 3:30 PM Medical Record Number: [id number] Patient Account Number: [id number] Date of Birth/Sex: 04/08/1917 (83 y.o. Female) Treating RN: Carolyne Fiscal, Debi Primary Care Shandrea Lusk: Raechel Ache, DAVID Other Clinician: Referring Ladell Lea: Raechel Ache, DAVID Treating Stepheni Cameron/Extender: Frann Rider in Treatment: 1 Visit Information History Since Last Visit All ordered tests and consults were completed: No Patient Arrived: Kasandra Knudsen Added or deleted any medications: No Arrival Time: 15:25 Any new allergies or adverse reactions: No Accompanied By: daughter Had a fall or experienced change in No Transfer Assistance: EasyPivot Patient activities of daily living that may affect Lift risk of falls: Patient Identification Verified: Yes Signs or symptoms of abuse/neglect since last visito No Secondary Verification Process Yes Hospitalized since last visit: No Completed: Has Dressing in Place as Prescribed: Yes Patient Requires Transmission-Based No Precautions: Pain Present Now: No Patient Has Alerts: No Electronic Signature(s) Signed: 02/27/2017 4:55:44 PM By: Alric Quan Entered By: Alric Quan on 02/27/2017 15:25:43 Salyers, Latorsha P. ([id number]) -------------------------------------------------------------------------------- Clinic Level of Care Assessment Details Patient Name: [name], [name] Date of Service: 02/27/2017 3:30 PM Medical Record Number: [id number] Patient Account Number: [id number] Date of Birth/Sex: [birth year] (83 y.o. Female) Treating RN: Carolyne Fiscal, Debi Primary Care Edwinna Rochette: Raechel Ache, DAVID Other Clinician: Referring Maxi Rodas: Raechel Ache, DAVID Treating Lutisha Knoche/Extender: Frann Rider in Treatment: 1 Clinic Level of Care Assessment Items TOOL 4 Quantity Score X - Use when only an EandM is performed on FOLLOW-UP visit 1  0 ASSESSMENTS - Nursing Assessment / Reassessment X - Reassessment of Co-morbidities (includes updates in patient status) 1 10 X- 1 5 Reassessment of Adherence to Treatment Plan ASSESSMENTS - Wound and Skin Assessment / Reassessment X - Simple Wound Assessment / Reassessment - one wound 1 5 []  - 0 Complex Wound Assessment / Reassessment - multiple wounds []  - 0 Dermatologic / Skin Assessment (not related to wound area) ASSESSMENTS - Focused Assessment []  - Circumferential Edema Measurements - multi extremities 0 []  - 0 Nutritional Assessment / Counseling / Intervention []  - 0 Lower Extremity Assessment (monofilament, tuning fork, pulses) []  - 0 Peripheral Arterial Disease Assessment (using hand held doppler) ASSESSMENTS - Ostomy and/or Continence Assessment and Care []  - Incontinence Assessment and Management 0 []  - 0 Ostomy Care Assessment and Management (repouching, etc.) PROCESS - Coordination of Care X - Simple Patient / Family Education for ongoing care 1 15 []  - 0 Complex (extensive) Patient / Family Education for ongoing care []  - 0 Staff obtains Programmer, systems, Records, Test Results / Process Orders []  - 0 Staff telephones HHA, Nursing Homes / Clarify orders / etc []  - 0 Routine Transfer to another Facility (non-emergent condition) []  - 0 Routine Hospital Admission (non-emergent condition) []  - 0 New Admissions / Biomedical engineer / Ordering NPWT, Apligraf, etc. []  - 0 Emergency Hospital Admission (emergent condition) X- 1 10 Simple Discharge Coordination Guterrez, Nimah P. ([id number]) []  - 0 Complex (extensive) Discharge Coordination PROCESS - Special Needs []  - Pediatric / Minor Patient Management 0 []  - 0 Isolation Patient Management []  - 0 Hearing / Language / Visual special needs []  - 0 Assessment of Community assistance (transportation, D/C planning, etc.) []  - 0 Additional assistance / Altered mentation []  - 0 Support Surface(s) Assessment (bed,  cushion, seat, etc.) INTERVENTIONS - Wound Cleansing / Measurement X - Simple Wound Cleansing - one wound 1 5 []  - 0 Complex Wound Cleansing - multiple wounds X- 1 5 Wound Imaging (photographs -  any number of wounds) []  - 0 Wound Tracing (instead of photographs) X- 1 5 Simple Wound Measurement - one wound []  - 0 Complex Wound Measurement - multiple wounds INTERVENTIONS - Wound Dressings X - Small Wound Dressing one or multiple wounds 1 10 []  - 0 Medium Wound Dressing one or multiple wounds []  - 0 Large Wound Dressing one or multiple wounds X- 1 5 Application of Medications - topical []  - 0 Application of Medications - injection INTERVENTIONS - Miscellaneous []  - External ear exam 0 []  - 0 Specimen Collection (cultures, biopsies, blood, body fluids, etc.) []  - 0 Specimen(s) / Culture(s) sent or taken to Lab for analysis []  - 0 Patient Transfer (multiple staff / Civil Service fast streamer / Similar devices) []  - 0 Simple Staple / Suture removal (25 or less) []  - 0 Complex Staple / Suture removal (26 or more) []  - 0 Hypo / Hyperglycemic Management (close monitor of Blood Glucose) []  - 0 Ankle / Brachial Index (ABI) - do not check if billed separately X- 1 5 Vital Signs Pennypacker, Mykayla P. ([id number]) Has the patient been seen at the hospital within the last three years: Yes Total Score: 80 Level Of Care: New/Established - Level 3 Electronic Signature(s) Signed: 02/27/2017 4:55:44 PM By: Alric Quan Entered By: Alric Quan on 02/27/2017 16:19:00 Cornwall, Analucia P. ([id number]) -------------------------------------------------------------------------------- Encounter Discharge Information Details Patient Name: [name], [name] Date of Service: 02/27/2017 3:30 PM Medical Record Number: [id number] Patient Account Number: [id number] Date of Birth/Sex: Jun 14, 1916 (83 y.o. Female) Treating RN: Carolyne Fiscal, Debi Primary Care Tyquisha Sharps: Raechel Ache, DAVID Other Clinician: Referring Maycee Blasco:  Raechel Ache, DAVID Treating Kaitlan Bin/Extender: Frann Rider in Treatment: 1 Encounter Discharge Information Items Discharge Pain Level: 0 Discharge Condition: Stable Ambulatory Status: Cane Discharge Destination: Home Transportation: Private Auto Accompanied By: daughter Schedule Follow-up Appointment: Yes Medication Reconciliation completed and No provided to Patient/Care Vanessia Bokhari: Provided on Clinical Summary of Care: 02/27/2017 Form Type Recipient Paper Patient IM Electronic Signature(s) Signed: 02/27/2017 4:09:27 PM By: Alric Quan Entered By: Alric Quan on 02/27/2017 16:09:27 Fedak, Jayona P. ([id number]) -------------------------------------------------------------------------------- Lower Extremity Assessment Details Patient Name: Grulke, Maryalyce P. Date of Service: 02/27/2017 3:30 PM Medical Record Number: [id number] Patient Account Number: [id number] Date of Birth/Sex: 1917-03-31 (83 y.o. Female) Treating RN: Ahmed Prima Primary Care Kennet Mccort: Raechel Ache, DAVID Other Clinician: Referring Parthena Fergeson: Raechel Ache, DAVID Treating Naydeline Morace/Extender: Frann Rider in Treatment: 1 Electronic Signature(s) Signed: 02/27/2017 4:55:44 PM By: Alric Quan Entered By: Alric Quan on 02/27/2017 15:33:10 Ungar, Allante P. ([id number]) -------------------------------------------------------------------------------- Multi Wound Chart Details Patient Name: Givans, Jaydi P. Date of Service: 02/27/2017 3:30 PM Medical Record Number: [id number] Patient Account Number: [id number] Date of Birth/Sex: 05/31/1916 (83 y.o. Female) Treating RN: Carolyne Fiscal, Debi Primary Care Calirose Mccance: Raechel Ache, DAVID Other Clinician: Referring Eion Timbrook: Raechel Ache, DAVID Treating Minh Roanhorse/Extender: Frann Rider in Treatment: 1 Vital Signs Height(in): 63 Pulse(bpm): 65 Weight(lbs): 149 Blood Pressure(mmHg): 118/98 Body Mass Index(BMI): 26 Temperature(F): Respiratory  Rate 16 (breaths/min): Photos: [N/A:N/A] Wound Location: Left Forearm N/A N/A Wounding Event: Trauma N/A N/A Primary Etiology: Skin Tear N/A N/A Comorbid History: Cataracts, Anemia, N/A N/A Hypertension, Osteoarthritis, Received Radiation Date Acquired: 02/03/2017 N/A N/A Weeks of Treatment: 1 N/A N/A Wound Status: Open N/A N/A Measurements L x W x D 5x3.4x0.1 N/A N/A (cm) Area (cm) : 13.352 N/A N/A Volume (cm) : 1.335 N/A N/A % Reduction in Area: 11.50% N/A N/A % Reduction in Volume: 11.60% N/A N/A Classification: Full Thickness With Exposed N/A N/A Support Structures Exudate Amount: Large N/A N/A Exudate Type: Sanguinous  N/A N/A Exudate Color: red N/A N/A Wound Margin: Flat and Intact N/A N/A Granulation Amount: Large (67-100%) N/A N/A Granulation Quality: Red N/A N/A Necrotic Amount: Small (1-33%) N/A N/A Exposed Structures: Fat Layer (Subcutaneous N/A N/A Tissue) Exposed: Yes Fascia: No Tendon: No Muscle: No Russum, Sukari P. ([id number]) Joint: No Bone: No Epithelialization: Small (1-33%) N/A N/A Periwound Skin Texture: Excoriation: No N/A N/A Induration: No Callus: No Crepitus: No Rash: No Scarring: No Periwound Skin Moisture: Maceration: No N/A N/A Dry/Scaly: No Periwound Skin Color: Atrophie Blanche: No N/A N/A Cyanosis: No Ecchymosis: No Erythema: No Hemosiderin Staining: No Mottled: No Pallor: No Rubor: No Temperature: No Abnormality N/A N/A Tenderness on Palpation: Yes N/A N/A Wound Preparation: Ulcer Cleansing: N/A N/A Rinsed/Irrigated with Saline Topical Anesthetic Applied: Other: lidocaine 4% Treatment Notes Wound #1 (Left Forearm) 1. Cleansed with: Clean wound with Normal Saline 2. Anesthetic Topical Lidocaine 4% cream to wound bed prior to debridement 4. Dressing Applied: Hydrafera Blue 5. Secondary Dressing Applied ABD Pad Kerlix/Conform 7. Secured with Tape Notes silvercel, coban, drawtex Electronic  Signature(s) Signed: 02/27/2017 4:13:27 PM By: Christin Fudge MD, FACS Entered By: Christin Fudge on 02/27/2017 16:13:26 Cadotte, Lizann P. ([id number]) -------------------------------------------------------------------------------- Multi-Disciplinary Care Plan Details Patient Name: Filley, Renay P. Date of Service: 02/27/2017 3:30 PM Medical Record Number: [id number] Patient Account Number: [id number] Date of Birth/Sex: 1917-02-28 (83 y.o. Female) Treating RN: Carolyne Fiscal, Debi Primary Care Nelva Hauk: Raechel Ache, DAVID Other Clinician: Referring Riggs Dineen: Raechel Ache, DAVID Treating Hanson Medeiros/Extender: Frann Rider in Treatment: 1 Active Inactive ` Abuse / Safety / Falls / Self Care Management Nursing Diagnoses: Impaired physical mobility Potential for falls Goals: Patient will not experience any injury related to falls Date Initiated: 02/17/2017 Target Resolution Date: 04/29/2017 Goal Status: Active Interventions: Assess fall risk on admission and as needed Notes: ` Orientation to the Wound Care Program Nursing Diagnoses: Knowledge deficit related to the wound healing center program Goals: Patient/caregiver will verbalize understanding of the [facility] Date Initiated: 02/17/2017 Target Resolution Date: 04/29/2017 Goal Status: Active Interventions: Provide education on orientation to the wound center Notes: ` Wound/Skin Impairment Nursing Diagnoses: Impaired tissue integrity Goals: Ulcer/skin breakdown will heal within 14 weeks Date Initiated: 02/17/2017 Target Resolution Date: 04/29/2017 Goal Status: Active Lieber, Natalye P. ([id number]) Interventions: Assess patient/caregiver ability to obtain necessary supplies Assess patient/caregiver ability to perform ulcer/skin care regimen upon admission and as needed Assess ulceration(s) every visit Notes: Electronic Signature(s) Signed: 02/27/2017 4:55:44 PM By: Alric Quan Entered By: Alric Quan on  02/27/2017 15:33:15 Meeuwsen, Sarahgrace P. ([id number]) -------------------------------------------------------------------------------- Pain Assessment Details Patient Name: Stephens, Azaryah P. Date of Service: 02/27/2017 3:30 PM Medical Record Number: [id number] Patient Account Number: [id number] Date of Birth/Sex: October 08, 1916 (83 y.o. Female) Treating RN: Carolyne Fiscal, Debi Primary Care Dylann Layne: Raechel Ache, DAVID Other Clinician: Referring Karon Cotterill: Raechel Ache, DAVID Treating Nakkia Mackiewicz/Extender: Frann Rider in Treatment: 1 Active Problems Location of Pain Severity and Description of Pain Patient Has Paino No Site Locations Pain Management and Medication Current Pain Management: Electronic Signature(s) Signed: 02/27/2017 4:55:44 PM By: Alric Quan Entered By: Alric Quan on 02/27/2017 15:27:19 Sutter, Khalilah P. ([id number]) -------------------------------------------------------------------------------- Patient/Caregiver Education Details Patient Name: Hugill, Vernia P. Date of Service: 02/27/2017 3:30 PM Medical Record Number: [id number] Patient Account Number: [id number] Date of Birth/Gender: 1916/05/01 (83 y.o. Female) Treating RN: Ahmed Prima Primary Care Physician: Raechel Ache, DAVID Other Clinician: Referring Physician: Raechel Ache, DAVID Treating Physician/Extender: Frann Rider in Treatment: 1 Education Assessment Education Provided To: Patient Education Topics Provided Wound/Skin Impairment: Handouts: Other: change dressing as ordered  Methods: Demonstration, Explain/Verbal Responses: State content correctly Electronic Signature(s) Signed: 02/27/2017 4:55:44 PM By: Alric Quan Entered By: Alric Quan on 02/27/2017 16:09:45 Zieske, Arica P. ([id number]) -------------------------------------------------------------------------------- Wound Assessment Details Patient Name: Thurgood, Harneet P. Date of Service: 02/27/2017 3:30 PM Medical Record Number:  [id number] Patient Account Number: [id number] Date of Birth/Sex: Jun 02, 1916 (83 y.o. Female) Treating RN: Carolyne Fiscal, Debi Primary Care Ader Fritze: Raechel Ache, DAVID Other Clinician: Referring Kiante Petrovich: Raechel Ache, DAVID Treating Zale Marcotte/Extender: Frann Rider in Treatment: 1 Wound Status Wound Number: 1 Primary Skin Tear Etiology: Wound Location: Left Forearm Wound Open Wounding Event: Trauma Status: Date Acquired: 02/03/2017 Comorbid Cataracts, Anemia, Hypertension, Weeks Of Treatment: 1 History: Osteoarthritis, Received Radiation Clustered Wound: No Photos Photo Uploaded By: Alric Quan on 02/27/2017 16:12:50 Wound Measurements Length: (cm) 5 Width: (cm) 3.4 Depth: (cm) 0.1 Area: (cm) 13.352 Volume: (cm) 1.335 % Reduction in Area: 11.5% % Reduction in Volume: 11.6% Epithelialization: Small (1-33%) Tunneling: No Undermining: No Wound Description Full Thickness With Exposed Support Classification: Structures Wound Margin: Flat and Intact Exudate Large Amount: Exudate Type: Sanguinous Exudate Color: red Foul Odor After Cleansing: No Slough/Fibrino Yes Wound Bed Granulation Amount: Large (67-100%) Exposed Structure Granulation Quality: Red Fascia Exposed: No Necrotic Amount: Small (1-33%) Fat Layer (Subcutaneous Tissue) Exposed: Yes Necrotic Quality: Adherent Slough Tendon Exposed: No Muscle Exposed: No Joint Exposed: No Bone Exposed: No Mervine, Nichoel P. ([id number]) Periwound Skin Texture Texture Color No Abnormalities Noted: No No Abnormalities Noted: No Callus: No Atrophie Blanche: No Crepitus: No Cyanosis: No Excoriation: No Ecchymosis: No Induration: No Erythema: No Rash: No Hemosiderin Staining: No Scarring: No Mottled: No Pallor: No Moisture Rubor: No No Abnormalities Noted: No Dry / Scaly: No Temperature / Pain Maceration: No Temperature: No Abnormality Tenderness on Palpation: Yes Wound Preparation Ulcer Cleansing:  Rinsed/Irrigated with Saline Topical Anesthetic Applied: Other: lidocaine 4%, Treatment Notes Wound #1 (Left Forearm) 1. Cleansed with: Clean wound with Normal Saline 2. Anesthetic Topical Lidocaine 4% cream to wound bed prior to debridement 4. Dressing Applied: Hydrafera Blue 5. Secondary Dressing Applied ABD Pad Kerlix/Conform 7. Secured with Tape Notes silvercel, coban, drawtex Electronic Signature(s) Signed: 02/27/2017 4:55:44 PM By: Alric Quan Entered By: Alric Quan on 02/27/2017 15:32:38 Vidas, Chene P. ([id number]) -------------------------------------------------------------------------------- Vitals Details Patient Name: Totino, Kawanna P. Date of Service: 02/27/2017 3:30 PM Medical Record Number: [id number] Patient Account Number: [id number] Date of Birth/Sex: [birth year] (83 y.o. Female) Treating RN: Carolyne Fiscal, Debi Primary Care Malikiah Debarr: Raechel Ache, DAVID Other Clinician: Referring Beaumont Austad: Raechel Ache, DAVID Treating Trinisha Paget/Extender: Frann Rider in Treatment: 1 Vital Signs Time Taken: 15:27 Pulse (bpm): 62 Height (in): 63 Respiratory Rate (breaths/min): 16 Weight (lbs): 149 Blood Pressure (mmHg): 118/98 Body Mass Index (BMI): 26.4 Reference Range: 80 - 120 mg / dl Electronic Signature(s) Signed: 02/27/2017 4:55:44 PM By: Alric Quan Entered By: Alric Quan on 02/27/2017 15:27:36

## 2017-03-09 ENCOUNTER — Encounter: Payer: Medicare Other | Admitting: Surgery

## 2017-03-09 DIAGNOSIS — S51802A Unspecified open wound of left forearm, initial encounter: Secondary | ICD-10-CM | POA: Diagnosis not present
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
## 2017-03-14 NOTE — Progress Notes (Signed)
[name] [name] ([id number]) Visit Report for 03/09/2017 Arrival Information Details Patient Name: [name] [name], [name] Date of Service: 03/09/2017 11:00 AM Medical Record Number: [id number] Patient Account Number: [id number] Date of Birth/Sex: [birth year] (83 y.o. Female) Treating RN: Carolyne Fiscal, Debi Primary Care [place]: Raechel Ache, DAVID Other Clinician: Referring Allan Bacigalupi: Raechel Ache, DAVID Treating Sumi Lye/Extender: Frann Rider in Treatment: 2 Visit Information History Since Last Visit All ordered tests and consults were completed: No Patient Arrived: [name] [name] Added or deleted any medications: No Arrival Time: 11:05 Any new allergies or adverse reactions: No Accompanied By: daughter Had a fall or experienced change in No Transfer Assistance: EasyPivot Patient activities of daily living that may affect Lift risk of falls: Patient Identification Verified: Yes Signs or symptoms of abuse/neglect since last visito No Secondary Verification Process Yes Hospitalized since last visit: No Completed: Has Dressing in Place as Prescribed: Yes Patient Requires Transmission-Based No Precautions: Pain Present Now: No Patient Has Alerts: No Electronic Signature(s) Signed: 03/09/2017 11:51:08 AM By: Alric Quan Entered By: Alric Quan on 03/09/2017 11:07:02 Mellin, Iracema P. ([id number]) -------------------------------------------------------------------------------- Encounter Discharge Information Details Patient Name: [name], [name] Date of Service: 03/09/2017 11:00 AM Medical Record Number: [id number] Patient Account Number: [id number] Date of Birth/Sex: 1916/12/16 (83 y.o. Female) Treating RN: Carolyne Fiscal, Debi Primary Care Treana Lacour: Raechel Ache, DAVID Other Clinician: Referring Nilan Iddings: Raechel Ache, DAVID Treating Dheeraj Hail/Extender: Frann Rider in Treatment: 2 Encounter Discharge Information Items Discharge Pain Level: 0 Discharge Condition: Stable Ambulatory Status:  Cane Discharge Destination: Home Transportation: Private Auto Accompanied By: daughter, son Schedule Follow-up Appointment: Yes Medication Reconciliation completed and No provided to Patient/Care Laren Orama: Provided on Clinical Summary of Care: 03/09/2017 Form Type Recipient Paper Patient IM Electronic Signature(s) Signed: 03/14/2017 8:35:27 AM By: Ruthine Dose Entered By: Ruthine Dose on 03/09/2017 11:35:01 Bluett, Luwana P. ([id number]) -------------------------------------------------------------------------------- Lower Extremity Assessment Details Patient Name: [name], [name] Date of Service: 03/09/2017 11:00 AM Medical Record Number: [id number] Patient Account Number: [id number] Date of Birth/Sex: 1917/03/22 (83 y.o. Female) Treating RN: Ahmed Prima Primary Care Keeon Zurn: Raechel Ache, DAVID Other Clinician: Referring Jada Fass: Raechel Ache, DAVID Treating Elva Breaker/Extender: Frann Rider in Treatment: 2 Electronic Signature(s) Signed: 03/09/2017 11:51:08 AM By: Alric Quan Entered By: Alric Quan on 03/09/2017 11:15:17 Woolbright, Tanice P. ([id number]) -------------------------------------------------------------------------------- Multi Wound Chart Details Patient Name: [name], [name] Date of Service: 03/09/2017 11:00 AM Medical Record Number: [id number] Patient Account Number: [id number] Date of Birth/Sex: 05/16/1916 (83 y.o. Female) Treating RN: Carolyne Fiscal, Debi Primary Care Heleena Miceli: Raechel Ache, DAVID Other Clinician: Referring Gannon Heinzman: Raechel Ache, DAVID Treating Tyqwan Pink/Extender: Frann Rider in Treatment: 2 Vital Signs Height(in): 63 Pulse(bpm): 82 Weight(lbs): 149 Blood Pressure(mmHg): 167/49 Body Mass Index(BMI): 26 Temperature(F): 97.6 Respiratory Rate 16 (breaths/min): Photos: [1:No Photos] [N/A:N/A] Wound Location: [1:Left Forearm] [N/A:N/A] Wounding Event: [1:Trauma] [N/A:N/A] Primary Etiology: [1:Skin Tear] [N/A:N/A] Comorbid History:  [1:Cataracts, Anemia, Hypertension, Osteoarthritis, Received Radiation] [N/A:N/A] Date Acquired: [1:02/03/2017] [N/A:N/A] Weeks of Treatment: [1:2] [N/A:N/A] Wound Status: [1:Open] [N/A:N/A] Measurements L x W x D [1:4.5x2.6x0.1] [N/A:N/A] (cm) Area (cm) : [1:9.189] [N/A:N/A] Volume (cm) : [1:0.919] [N/A:N/A] % Reduction in Area: [1:39.10%] [N/A:N/A] % Reduction in Volume: [1:39.10%] [N/A:N/A] Classification: [1:Full Thickness With Exposed Support Structures] [N/A:N/A] Exudate Amount: [1:Large] [N/A:N/A] Exudate Type: [1:Sanguinous] [N/A:N/A] Exudate Color: [1:red] [N/A:N/A] Wound Margin: [1:Flat and Intact] [N/A:N/A] Granulation Amount: [1:Large (67-100%)] [N/A:N/A] Granulation Quality: [1:Red] [N/A:N/A] Necrotic Amount: [1:Small (1-33%)] [N/A:N/A] Exposed Structures: [1:Fat Layer (Subcutaneous Tissue) Exposed: Yes Fascia: No Tendon: No Muscle: No Joint: No Bone: No] [N/A:N/A] Epithelialization: [1:Small (1-33%)] [N/A:N/A] Periwound Skin Texture: [1:Excoriation: No Induration: No Callus: No  Crepitus: No] [N/A:N/A] Rash: No Scarring: No Periwound Skin Moisture: Maceration: No N/A N/A Dry/Scaly: No Periwound Skin Color: Atrophie Blanche: No N/A N/A Cyanosis: No Ecchymosis: No Erythema: No Hemosiderin Staining: No Mottled: No Pallor: No Rubor: No Temperature: No Abnormality N/A N/A Tenderness on Palpation: Yes N/A N/A Wound Preparation: Ulcer Cleansing: N/A N/A Rinsed/Irrigated with Saline Topical Anesthetic Applied: Other: lidocaine 4% Procedures Performed: CHEM CAUT GRANULATION N/A N/A TISS Treatment Notes Wound #1 (Left Forearm) 1. Cleansed with: Clean wound with Normal Saline 2. Anesthetic Topical Lidocaine 4% cream to wound bed prior to debridement 4. Dressing Applied: Hydrafera Blue 5. Secondary Dressing Applied ABD Pad Dry Gauze Kerlix/Conform 7. Secured with Tape Notes silvercel, coban, xtrasorb, TEFL teacher) Signed:  03/09/2017 11:31:33 AM By: Christin Fudge MD, FACS Entered By: Christin Fudge on 03/09/2017 11:31:33 Drumwright, Karlynn P. ([id number]) -------------------------------------------------------------------------------- Multi-Disciplinary Care Plan Details Patient Name: Naef, Namita P. Date of Service: 03/09/2017 11:00 AM Medical Record Number: [id number] Patient Account Number: [id number] Date of Birth/Sex: [birth year] (83 y.o. Female) Treating RN: Carolyne Fiscal, Debi Primary Care Ragena Fiola: Raechel Ache, DAVID Other Clinician: Referring Alfonzo Arca: Raechel Ache, DAVID Treating Dacian Orrico/Extender: Frann Rider in Treatment: 2 Active Inactive ` Abuse / Safety / Falls / Self Care Management Nursing Diagnoses: Impaired physical mobility Potential for falls Goals: Patient will not experience any injury related to falls Date Initiated: 02/17/2017 Target Resolution Date: 04/29/2017 Goal Status: Active Interventions: Assess fall risk on admission and as needed Notes: ` Orientation to the Wound Care Program Nursing Diagnoses: Knowledge deficit related to the wound healing center program Goals: Patient/caregiver will verbalize understanding of the [facility] Date Initiated: 02/17/2017 Target Resolution Date: 04/29/2017 Goal Status: Active Interventions: Provide education on orientation to the wound center Notes: ` Wound/Skin Impairment Nursing Diagnoses: Impaired tissue integrity Goals: Ulcer/skin breakdown will heal within 14 weeks Date Initiated: 02/17/2017 Target Resolution Date: 04/29/2017 Goal Status: Active Adeyemi, Sydny P. ([id number]) Interventions: Assess patient/caregiver ability to obtain necessary supplies Assess patient/caregiver ability to perform ulcer/skin care regimen upon admission and as needed Assess ulceration(s) every visit Notes: Electronic Signature(s) Signed: 03/09/2017 11:51:08 AM By: Alric Quan Entered By: Alric Quan on 03/09/2017  Kinde, Chelbi P. ([id number]) -------------------------------------------------------------------------------- Pain Assessment Details Patient Name: Kapral, Lajuanna P. Date of Service: 03/09/2017 11:00 AM Medical Record Number: [id number] Patient Account Number: [id number] Date of Birth/Sex: Aug 19, 1916 (83 y.o. Female) Treating RN: Carolyne Fiscal, Debi Primary Care Annalysse Shoemaker: Raechel Ache, DAVID Other Clinician: Referring Levy Wellman: Raechel Ache, DAVID Treating Mandy Peeks/Extender: Frann Rider in Treatment: 2 Active Problems Location of Pain Severity and Description of Pain Patient Has Paino No Site Locations Pain Management and Medication Current Pain Management: Electronic Signature(s) Signed: 03/09/2017 11:51:08 AM By: Alric Quan Entered By: Alric Quan on 03/09/2017 11:07:09 Bazen, Ilean P. ([id number]) -------------------------------------------------------------------------------- Patient/Caregiver Education Details Patient Name: Vejar, Enedelia P. Date of Service: 03/09/2017 11:00 AM Medical Record Number: [id number] Patient Account Number: [id number] Date of Birth/Gender: [birth year] (83 y.o. Female) Treating RN: Carolyne Fiscal, Debi Primary Care Physician: Raechel Ache, DAVID Other Clinician: Referring Physician: Raechel Ache, DAVID Treating Physician/Extender: Frann Rider in Treatment: 2 Education Assessment Education Provided To: Patient Education Topics Provided Wound/Skin Impairment: Handouts: Other: change dressing as ordered Methods: Demonstration, Explain/Verbal Responses: State content correctly Electronic Signature(s) Signed: 03/09/2017 11:51:08 AM By: Alric Quan Entered By: Alric Quan on 03/09/2017 11:25:14 Vangorden, Dakia P. ([id number]) -------------------------------------------------------------------------------- Wound Assessment Details Patient Name: Scovill, Analaura P. Date of Service: 03/09/2017 11:00 AM Medical Record Number:  [id number] Patient Account Number: [id number] Date of Birth/Sex: 07-20-1916 (83 y.o. Female) Treating  RN: Ahmed Prima Primary Care Abbagale Goguen: Raechel Ache, DAVID Other Clinician: Referring Maddisen Vought: Raechel Ache, DAVID Treating Kileen Lange/Extender: Frann Rider in Treatment: 2 Wound Status Wound Number: 1 Primary Skin Tear Etiology: Wound Location: Left Forearm Wound Open Wounding Event: Trauma Status: Date Acquired: 02/03/2017 Comorbid Cataracts, Anemia, Hypertension, Weeks Of Treatment: 2 History: Osteoarthritis, Received Radiation Clustered Wound: No Photos Photo Uploaded By: Alric Quan on 03/09/2017 11:53:07 Wound Measurements Length: (cm) 4.5 Width: (cm) 2.6 Depth: (cm) 0.1 Area: (cm) 9.189 Volume: (cm) 0.919 % Reduction in Area: 39.1% % Reduction in Volume: 39.1% Epithelialization: Small (1-33%) Tunneling: No Undermining: No Wound Description Full Thickness With Exposed Support Classification: Structures Wound Margin: Flat and Intact Exudate Large Amount: Exudate Type: Sanguinous Exudate Color: red Foul Odor After Cleansing: No Slough/Fibrino Yes Wound Bed Granulation Amount: Large (67-100%) Exposed Structure Granulation Quality: Red Fascia Exposed: No Necrotic Amount: Small (1-33%) Fat Layer (Subcutaneous Tissue) Exposed: Yes Necrotic Quality: Adherent Slough Tendon Exposed: No Muscle Exposed: No Joint Exposed: No Bone Exposed: No Sawka, Leonna P. ([id number]) Periwound Skin Texture Texture Color No Abnormalities Noted: No No Abnormalities Noted: No Callus: No Atrophie Blanche: No Crepitus: No Cyanosis: No Excoriation: No Ecchymosis: No Induration: No Erythema: No Rash: No Hemosiderin Staining: No Scarring: No Mottled: No Pallor: No Moisture Rubor: No No Abnormalities Noted: No Dry / Scaly: No Temperature / Pain Maceration: No Temperature: No Abnormality Tenderness on Palpation: Yes Wound Preparation Ulcer Cleansing:  Rinsed/Irrigated with Saline Topical Anesthetic Applied: Other: lidocaine 4%, Treatment Notes Wound #1 (Left Forearm) 1. Cleansed with: Clean wound with Normal Saline 2. Anesthetic Topical Lidocaine 4% cream to wound bed prior to debridement 4. Dressing Applied: Hydrafera Blue 5. Secondary Dressing Applied ABD Pad Dry Gauze Kerlix/Conform 7. Secured with Tape Notes silvercel, coban, xtrasorb, TEFL teacher) Signed: 03/09/2017 11:51:08 AM By: Alric Quan Entered By: Alric Quan on 03/09/2017 11:14:44 Lenk, Chrystian P. ([id number]) -------------------------------------------------------------------------------- Vitals Details Patient Name: Crance, Ayra P. Date of Service: 03/09/2017 11:00 AM Medical Record Number: [id number] Patient Account Number: [id number] Date of Birth/Sex: 08/13/1916 (83 y.o. Female) Treating RN: Carolyne Fiscal, Debi Primary Care Kristin Lamagna: Raechel Ache, DAVID Other Clinician: Referring Soliyana Mcchristian: Raechel Ache, DAVID Treating Boyd Litaker/Extender: Frann Rider in Treatment: 2 Vital Signs Time Taken: 11:07 Temperature (F): 97.6 Height (in): 63 Pulse (bpm): 68 Weight (lbs): 149 Respiratory Rate (breaths/min): 16 Body Mass Index (BMI): 26.4 Blood Pressure (mmHg): 167/49 Reference Range: 80 - 120 mg / dl Electronic Signature(s) Signed: 03/09/2017 11:51:08 AM By: Alric Quan Entered By: Alric Quan on 03/09/2017 11:09:02

## 2017-03-15 DIAGNOSIS — S51802A Unspecified open wound of left forearm, initial encounter: Secondary | ICD-10-CM | POA: Diagnosis not present

## 2017-03-16 NOTE — Progress Notes (Signed)
[name] [name] ([id number]) Visit Report for 03/15/2017 Arrival Information Details Patient Name: [name], [name] Date of Service: 03/15/2017 8:45 AM Medical Record Number: [id number] Patient Account Number: [id number] Date of Birth/Sex: 01-08-1917 (83 y.o. Female) Treating RN: Carolyne Fiscal, Debi Primary Care Erskine Steinfeldt: Raechel Ache, DAVID Other Clinician: Referring Kailyn Vanderslice: Raechel Ache, DAVID Treating Daeshawn Redmann/Extender: Melburn Hake, HOYT Weeks in Treatment: 3 Visit Information History Since Last Visit All ordered tests and consults were completed: No Patient Arrived: Cane Added or deleted any medications: No Arrival Time: 09:03 Any new allergies or adverse reactions: No Accompanied By: daughter, son Had a fall or experienced change in No Transfer Assistance: EasyPivot Patient activities of daily living that may affect Lift risk of falls: Patient Identification Verified: Yes Signs or symptoms of abuse/neglect since last visito No Secondary Verification Process Yes Hospitalized since last visit: No Completed: Has Dressing in Place as Prescribed: Yes Patient Requires Transmission-Based No Precautions: Pain Present Now: No Patient Has Alerts: No Electronic Signature(s) Signed: 03/15/2017 2:49:42 PM By: Alric Quan Entered By: Alric Quan on 03/15/2017 09:07:50 [name], [name] ([id number]) -------------------------------------------------------------------------------- Clinic Level of Care Assessment Details Patient Name: [name], [name] Date of Service: 03/15/2017 8:45 AM Medical Record Number: [id number] Patient Account Number: [id number] Date of Birth/Sex: 17-Apr-1917 (83 y.o. Female) Treating RN: Carolyne Fiscal, Debi Primary Care Daelin Haste: Raechel Ache, DAVID Other Clinician: Referring Jowell Bossi: Raechel Ache, DAVID Treating Lion Fernandez/Extender: Melburn Hake, HOYT Weeks in Treatment: 3 Clinic Level of Care Assessment Items TOOL 4 Quantity Score X - Use when only an EandM is performed on  FOLLOW-UP visit 1 0 ASSESSMENTS - Nursing Assessment / Reassessment X - Reassessment of Co-morbidities (includes updates in patient status) 1 10 X- 1 5 Reassessment of Adherence to Treatment Plan ASSESSMENTS - Wound and Skin Assessment / Reassessment X - Simple Wound Assessment / Reassessment - one wound 1 5 []  - 0 Complex Wound Assessment / Reassessment - multiple wounds []  - 0 Dermatologic / Skin Assessment (not related to wound area) ASSESSMENTS - Focused Assessment []  - Circumferential Edema Measurements - multi extremities 0 []  - 0 Nutritional Assessment / Counseling / Intervention []  - 0 Lower Extremity Assessment (monofilament, tuning fork, pulses) []  - 0 Peripheral Arterial Disease Assessment (using hand held doppler) ASSESSMENTS - Ostomy and/or Continence Assessment and Care []  - Incontinence Assessment and Management 0 []  - 0 Ostomy Care Assessment and Management (repouching, etc.) PROCESS - Coordination of Care X - Simple Patient / Family Education for ongoing care 1 15 []  - 0 Complex (extensive) Patient / Family Education for ongoing care []  - 0 Staff obtains Programmer, systems, Records, Test Results / Process Orders []  - 0 Staff telephones HHA, Nursing Homes / Clarify orders / etc []  - 0 Routine Transfer to another Facility (non-emergent condition) []  - 0 Routine Hospital Admission (non-emergent condition) []  - 0 New Admissions / Biomedical engineer / Ordering NPWT, Apligraf, etc. []  - 0 Emergency Hospital Admission (emergent condition) X- 1 10 Simple Discharge Coordination [name], [name] ([id number]) []  - 0 Complex (extensive) Discharge Coordination PROCESS - Special Needs []  - Pediatric / Minor Patient Management 0 []  - 0 Isolation Patient Management []  - 0 Hearing / Language / Visual special needs []  - 0 Assessment of Community assistance (transportation, D/C planning, etc.) []  - 0 Additional assistance / Altered mentation []  - 0 Support Surface(s)  Assessment (bed, cushion, seat, etc.) INTERVENTIONS - Wound Cleansing / Measurement X - Simple Wound Cleansing - one wound 1 5 []  - 0 Complex Wound Cleansing - multiple wounds X- 1 5  Wound Imaging (photographs - any number of wounds) []  - 0 Wound Tracing (instead of photographs) X- 1 5 Simple Wound Measurement - one wound []  - 0 Complex Wound Measurement - multiple wounds INTERVENTIONS - Wound Dressings X - Small Wound Dressing one or multiple wounds 1 10 []  - 0 Medium Wound Dressing one or multiple wounds []  - 0 Large Wound Dressing one or multiple wounds []  - 0 Application of Medications - topical []  - 0 Application of Medications - injection INTERVENTIONS - Miscellaneous []  - External ear exam 0 []  - 0 Specimen Collection (cultures, biopsies, blood, body fluids, etc.) []  - 0 Specimen(s) / Culture(s) sent or taken to Lab for analysis []  - 0 Patient Transfer (multiple staff / Civil Service fast streamer / Similar devices) []  - 0 Simple Staple / Suture removal (25 or less) []  - 0 Complex Staple / Suture removal (26 or more) []  - 0 Hypo / Hyperglycemic Management (close monitor of Blood Glucose) []  - 0 Ankle / Brachial Index (ABI) - do not check if billed separately []  - 0 Vital Signs [name], [name] ([id number]) Has the patient been seen at the hospital within the last three years: Yes Total Score: 70 Level Of Care: New/Established - Level 2 Electronic Signature(s) Signed: 03/15/2017 2:49:42 PM By: Alric Quan Entered By: Alric Quan on 03/15/2017 14:43:50 [name], [name] ([id number]) -------------------------------------------------------------------------------- Encounter Discharge Information Details Patient Name: [name], [name] Date of Service: 03/15/2017 8:45 AM Medical Record Number: [id number] Patient Account Number: [id number] Date of Birth/Sex: 06-12-1916 (83 y.o. Female) Treating RN: Carolyne Fiscal, Debi Primary Care Aviendha Azbell: Raechel Ache, DAVID Other  Clinician: Referring Fayrene Towner: Raechel Ache, DAVID Treating Carlin Attridge/Extender: Melburn Hake, HOYT Weeks in Treatment: 3 Encounter Discharge Information Items Discharge Pain Level: 0 Discharge Condition: Stable Ambulatory Status: Cane Discharge Destination: Home Private Transportation: Auto daughter, Accompanied By: son Schedule Follow-up Appointment: Yes Medication Reconciliation completed and No provided to Patient/Care Lennon Richins: Clinical Summary of Care: Electronic Signature(s) Signed: 03/15/2017 2:49:42 PM By: Alric Quan Entered By: Alric Quan on 03/15/2017 09:22:25 [name], [name] ([id number]) -------------------------------------------------------------------------------- Patient/Caregiver Education Details Patient Name: [name], [name] Date of Service: 03/15/2017 8:45 AM Medical Record Number: [id number] Patient Account Number: [id number] Date of Birth/Gender: [birth year] (83 y.o. Female) Treating RN: Carolyne Fiscal, Debi Primary Care Physician: Raechel Ache, DAVID Other Clinician: Referring Physician: Raechel Ache, DAVID Treating Physician/Extender: Sharalyn Ink in Treatment: 3 Education Assessment Education Provided To: Patient Education Topics Provided Wound/Skin Impairment: Handouts: Other: do noy get dressing wet Methods: Demonstration, Explain/Verbal Responses: State content correctly Electronic Signature(s) Signed: 03/15/2017 2:49:42 PM By: Alric Quan Entered By: Alric Quan on 03/15/2017 09:22:11 [name], [name] ([id number]) -------------------------------------------------------------------------------- Wound Assessment Details Patient Name: Donnell, Tarryn P. Date of Service: 03/15/2017 8:45 AM Medical Record Number: [id number] Patient Account Number: [id number] Date of Birth/Sex: 12/22/1916 (83 y.o. Female) Treating RN: Carolyne Fiscal, Debi Primary Care Rhonin Trott: Raechel Ache, DAVID Other Clinician: Referring Niasia Lanphear: Raechel Ache, DAVID Treating  Dominga Mcduffie/Extender: Melburn Hake, HOYT Weeks in Treatment: 3 Wound Status Wound Number: 1 Primary Skin Tear Etiology: Wound Location: Left Forearm Wound Open Wounding Event: Trauma Status: Date Acquired: 02/03/2017 Comorbid Cataracts, Anemia, Hypertension, Weeks Of Treatment: 3 History: Osteoarthritis, Received Radiation Clustered Wound: No Photos Photo Uploaded By: Alric Quan on 03/15/2017 14:39:44 Wound Measurements Length: (cm) 5.2 Width: (cm) 3 Depth: (cm) 0.1 Area: (cm) 12.252 Volume: (cm) 1.225 % Reduction in Area: 18.8% % Reduction in Volume: 18.9% Epithelialization: Small (1-33%) Tunneling: No Undermining: No Wound Description Full Thickness With Exposed Support Classification: Structures Wound Margin: Flat and Intact Exudate Large  Amount: Exudate Type: Sanguinous Exudate Color: red Foul Odor After Cleansing: No Slough/Fibrino Yes Wound Bed Granulation Amount: Large (67-100%) Exposed Structure Granulation Quality: Red Fascia Exposed: No Necrotic Amount: Small (1-33%) Fat Layer (Subcutaneous Tissue) Exposed: Yes Necrotic Quality: Adherent Slough Tendon Exposed: No Muscle Exposed: No Joint Exposed: No Bone Exposed: No Figuereo, Negin P. ([id number]) Periwound Skin Texture Texture Color No Abnormalities Noted: No No Abnormalities Noted: No Callus: No Atrophie Blanche: No Crepitus: No Cyanosis: No Excoriation: No Ecchymosis: No Induration: No Erythema: No Rash: No Hemosiderin Staining: No Scarring: No Mottled: No Pallor: No Moisture Rubor: No No Abnormalities Noted: No Dry / Scaly: No Temperature / Pain Maceration: No Temperature: No Abnormality Tenderness on Palpation: Yes Wound Preparation Ulcer Cleansing: Rinsed/Irrigated with Saline Topical Anesthetic Applied: None Treatment Notes Wound #1 (Left Forearm) 1. Cleansed with: Clean wound with Normal Saline 3. Peri-wound Care: Barrier cream 4. Dressing Applied: Hydrafera  Blue Other dressing (specify in notes) 5. Secondary Dressing Applied ABD Pad Kerlix/Conform 7. Secured with Tape Notes silvercel, coban, adaptic Electronic Signature(s) Signed: 03/15/2017 2:49:42 PM By: Alric Quan Entered By: Alric Quan on 03/15/2017 09:13:59

## 2017-03-23 ENCOUNTER — Encounter: Payer: Medicare Other | Admitting: Surgery

## 2017-03-23 DIAGNOSIS — S51802A Unspecified open wound of left forearm, initial encounter: Secondary | ICD-10-CM | POA: Diagnosis not present

## 2017-03-24 NOTE — Progress Notes (Signed)
UMAIMA, SCHOLTEN ([id number]) Visit Report for 03/23/2017 Arrival Information Details Patient Name: [name], [name] Date of Service: 03/23/2017 2:15 PM Medical Record Number: [id number] Patient Account Number: [id number] Date of Birth/Sex: 1916/05/08 (83 y.o. Female) Treating RN: [name] [name] Primary Care [name] [name] Other Clinician: Referring [name] [name] Treating Dolph Tavano/Extender: [name] [name] in Treatment: 4 Visit Information History Since Last Visit All ordered tests and consults were completed: No Patient Arrived: Ambulatory Added or deleted any medications: No Arrival Time: 14:10 Any new allergies or adverse reactions: No Accompanied By: daughter, son Had a fall or experienced change in No activities of daily living that may affect Transfer Assistance: None risk of falls: Patient Identification Verified: Yes Signs or symptoms of abuse/neglect since last visito No Secondary Verification Process Completed: Yes Hospitalized since last visit: No Patient Requires Transmission-Based No Has Dressing in Place as Prescribed: Yes Precautions: Pain Present Now: No Patient Has Alerts: No Electronic Signature(s) Signed: 03/23/2017 2:54:02 PM By: Alric Quan Entered By: Alric Quan on 03/23/2017 14:12:16 [name], [name] ([id number]) -------------------------------------------------------------------------------- Encounter Discharge Information Details Patient Name: [name], [name] Date of Service: 03/23/2017 2:15 PM Medical Record Number: [id number] Patient Account Number: [id number] Date of Birth/Sex: 1916-09-23 (83 y.o. Female) Treating RN: [name] [name] Primary Care Camrin Gearheart: [name] [name] Other Clinician: Referring [name] [name] Treating Lashawndra Lampkins/Extender: [name] [name] in Treatment: 4 Encounter Discharge Information Items Discharge Pain Level: 0 Discharge Condition: Stable Ambulatory Status:  Ambulatory Discharge Destination: Home Transportation: Private Auto Accompanied By: daughter, son Schedule Follow-up Appointment: Yes Medication Reconciliation completed and No provided to Patient/Care Tomie Elko: Provided on Clinical Summary of Care: 03/23/2017 Form Type Recipient Paper Patient IM Electronic Signature(s) Signed: 03/23/2017 2:42:43 PM By: Sharon Mt Previous Signature: 03/23/2017 2:24:00 PM Version By: Alric Quan Entered By: Sharon Mt on 03/23/2017 14:42:42 [name], [name] ([id number]) -------------------------------------------------------------------------------- Lower Extremity Assessment Details Patient Name: [name], [name] Date of Service: 03/23/2017 2:15 PM Medical Record Number: [id number] Patient Account Number: [id number] Date of Birth/Sex: 1916-12-01 (83 y.o. Female) Treating RN: [name] [name] Primary Care Saga Balthazar: [name] [name] Other Clinician: Referring Alya Smaltz: [name] [name] Treating Adilynn Bessey/Extender: [name] [name] in Treatment: 4 Electronic Signature(s) Signed: 03/23/2017 2:54:02 PM By: Alric Quan Entered By: Alric Quan on 03/23/2017 14:21:24 Evangelist, Jurnei P. ([id number]) -------------------------------------------------------------------------------- Multi Wound Chart Details Patient Name: [name], [name] Date of Service: 03/23/2017 2:15 PM Medical Record Number: [id number] Patient Account Number: [id number] Date of Birth/Sex: 09-20-1916 (83 y.o. Female) Treating RN: [name] [name] Primary Care Wasyl Dornfeld: [name] [name] Other Clinician: Referring Beckett Maden: [name] [name] Treating Hoke Baer/Extender: [name] [name] in Treatment: 4 Vital Signs Height(in): 40 Pulse(bpm): 27 Weight(lbs): 149 Blood Pressure(mmHg): 142/50 Body Mass Index(BMI): 26 Temperature(F): Respiratory Rate 16 (breaths/min): Photos: [1:No Photos] [N/A:N/A] Wound Location: [1:Left Forearm] [N/A:N/A] Wounding Event: [1:Trauma]  [N/A:N/A] Primary Etiology: [1:Skin Tear] [N/A:N/A] Comorbid History: [1:Cataracts, Anemia, Hypertension, Osteoarthritis, Received Radiation] [N/A:N/A] Date Acquired: [1:02/03/2017] [N/A:N/A] Weeks of Treatment: [1:4] [N/A:N/A] Wound Status: [1:Open] [N/A:N/A] Measurements L x W x D [1:4.9x2.3x0.1] [N/A:N/A] (cm) Area (cm) : [1:8.851] [N/A:N/A] Volume (cm) : [1:0.885] [N/A:N/A] % Reduction in Area: [1:41.40%] [N/A:N/A] % Reduction in Volume: [1:41.40%] [N/A:N/A] Classification: [1:Full Thickness With Exposed Support Structures] [N/A:N/A] Exudate Amount: [1:Large] [N/A:N/A] Exudate Type: [1:Sanguinous] [N/A:N/A] Exudate Color: [1:red] [N/A:N/A] Wound Margin: [1:Flat and Intact] [N/A:N/A] Granulation Amount: [1:Large (67-100%)] [N/A:N/A] Granulation Quality: [1:Red, Hyper-granulation] [N/A:N/A] Necrotic Amount: [1:None Present (0%)] [N/A:N/A] Exposed Structures: [1:Fat Layer (Subcutaneous Tissue) Exposed: Yes Fascia: No Tendon: No Muscle: No Joint: No Bone: No] [N/A:N/A] Epithelialization: [1:Small (1-33%)] [N/A:N/A]  Periwound Skin Texture: [1:Excoriation: No Induration: No Callus: No Crepitus: No] [N/A:N/A] Rash: No Scarring: No Periwound Skin Moisture: Maceration: No N/A N/A Dry/Scaly: No Periwound Skin Color: Atrophie Blanche: No N/A N/A Cyanosis: No Ecchymosis: No Erythema: No Hemosiderin Staining: No Mottled: No Pallor: No Rubor: No Temperature: No Abnormality N/A N/A Tenderness on Palpation: Yes N/A N/A Wound Preparation: Ulcer Cleansing: N/A N/A Rinsed/Irrigated with Saline Topical Anesthetic Applied: Other: lidocaine 4% Procedures Performed: CHEM CAUT GRANULATION N/A N/A TISS Treatment Notes Wound #1 (Left Forearm) 1. Cleansed with: Clean wound with Normal Saline 2. Anesthetic Topical Lidocaine 4% cream to wound bed prior to debridement 4. Dressing Applied: Hydrafera Blue 5. Secondary Dressing Applied Kerlix/Conform Notes coban, drawtex Electronic  Signature(s) Signed: 03/23/2017 2:45:50 PM By: Christin Fudge MD, FACS Previous Signature: 03/23/2017 2:23:36 PM Version By: Alric Quan Entered By: Christin Fudge on 03/23/2017 14:45:50 Bozza, Dandria P. ([id number]) -------------------------------------------------------------------------------- Multi-Disciplinary Care Plan Details Patient Name: [name], [name] Date of Service: 03/23/2017 2:15 PM Medical Record Number: [id number] Patient Account Number: [id number] Date of Birth/Sex: October 13, 1916 (83 y.o. Female) Treating RN: [name] [name] Primary Care Driana Dazey: [name] [name] Other Clinician: Referring Kashawna Manzer: [name] [name] Treating Kable Haywood/Extender: [name] [name] in Treatment: 4 Active Inactive ` Abuse / Safety / Falls / Self Care Management Nursing Diagnoses: Impaired physical mobility Potential for falls Goals: Patient will not experience any injury related to falls Date Initiated: 02/17/2017 Target Resolution Date: 04/29/2017 Goal Status: Active Interventions: Assess fall risk on admission and as needed Notes: ` Orientation to the Wound Care Program Nursing Diagnoses: Knowledge deficit related to the wound healing center program Goals: Patient/caregiver will verbalize understanding of the [facility] Date Initiated: 02/17/2017 Target Resolution Date: 04/29/2017 Goal Status: Active Interventions: Provide education on orientation to the wound center Notes: ` Wound/Skin Impairment Nursing Diagnoses: Impaired tissue integrity Goals: Ulcer/skin breakdown will heal within 14 weeks Date Initiated: 02/17/2017 Target Resolution Date: 04/29/2017 Goal Status: Active Mennenga, Diamonds P. ([id number]) Interventions: Assess patient/caregiver ability to obtain necessary supplies Assess patient/caregiver ability to perform ulcer/skin care regimen upon admission and as needed Assess ulceration(s) every visit Notes: Electronic Signature(s) Signed:  03/23/2017 2:23:30 PM By: Alric Quan Entered By: Alric Quan on 03/23/2017 14:23:30 [name], [name] ([id number]) -------------------------------------------------------------------------------- Pain Assessment Details Patient Name: Standiford, Belle P. Date of Service: 03/23/2017 2:15 PM Medical Record Number: [id number] Patient Account Number: [id number] Date of Birth/Sex: Aug 25, 1916 (83 y.o. Female) Treating RN: [name] [name] Primary Care Kyliah Deanda: [name] [name] Other Clinician: Referring Jameson Tormey: [name] [name] Treating Latiya Navia/Extender: [name] [name] in Treatment: 4 Active Problems Location of Pain Severity and Description of Pain Patient Has Paino No Site Locations Pain Management and Medication Current Pain Management: Electronic Signature(s) Signed: 03/23/2017 2:54:02 PM By: Alric Quan Entered By: Alric Quan on 03/23/2017 14:12:21 [name], [name] ([id number]) -------------------------------------------------------------------------------- Patient/Caregiver Education Details Patient Name: Dubs, Renu P. Date of Service: 03/23/2017 2:15 PM Medical Record Number: [id number] Patient Account Number: [id number] Date of Birth/Gender: 10-Feb-1917 (83 y.o. Female) Treating RN: [name] [name] Primary Care Physician: [name] [name] Other Clinician: Referring Physician: Raechel Ache, [name] Treating Physician/Extender: [name] [name] in Treatment: 4 Education Assessment Education Provided To: Patient Education Topics Provided Wound/Skin Impairment: Handouts: Other: change dressing as ordered Methods: Demonstration, Explain/Verbal Responses: State content correctly Electronic Signature(s) Signed: 03/23/2017 2:54:02 PM By: Alric Quan Entered By: Alric Quan on 03/23/2017 14:24:13 [name], [name] ([id number]) -------------------------------------------------------------------------------- Wound Assessment Details Patient Name: [name],  [name] Date of Service: 03/23/2017 2:15 PM Medical Record Number: [id number] Patient Account Number: [id number]  Date of Birth/Sex: 1916-07-09 (83 y.o. Female) Treating RN: [name] [name] Primary Care Xitlaly Ault: [name] [name] Other Clinician: Referring Venson Ferencz: [name] [name] Treating Elayne Gruver/Extender: [name] [name] in Treatment: 4 Wound Status Wound Number: 1 Primary Skin Tear Etiology: Wound Location: Left Forearm Wound Open Wounding Event: Trauma Status: Date Acquired: 02/03/2017 Comorbid Cataracts, Anemia, Hypertension, Weeks Of Treatment: 4 History: Osteoarthritis, Received Radiation Clustered Wound: No Photos Photo Uploaded By: Alric Quan on 03/23/2017 14:50:31 Wound Measurements Length: (cm) 4.9 Width: (cm) 2.3 Depth: (cm) 0.1 Area: (cm) 8.851 Volume: (cm) 0.885 % Reduction in Area: 41.4% % Reduction in Volume: 41.4% Epithelialization: Small (1-33%) Tunneling: No Undermining: No Wound Description Full Thickness With Exposed Support Classification: Structures Wound Margin: Flat and Intact Exudate Large Amount: Exudate Type: Sanguinous Exudate Color: red Foul Odor After Cleansing: No Slough/Fibrino Yes Wound Bed Granulation Amount: Large (67-100%) Exposed Structure Granulation Quality: Red, Hyper-granulation Fascia Exposed: No Necrotic Amount: None Present (0%) Fat Layer (Subcutaneous Tissue) Exposed: Yes Tendon Exposed: No Muscle Exposed: No Joint Exposed: No Bone Exposed: No [name], [name] ([id number]) Periwound Skin Texture Texture Color No Abnormalities Noted: No No Abnormalities Noted: No Callus: No Atrophie Blanche: No Crepitus: No Cyanosis: No Excoriation: No Ecchymosis: No Induration: No Erythema: No Rash: No Hemosiderin Staining: No Scarring: No Mottled: No Pallor: No Moisture Rubor: No No Abnormalities Noted: No Dry / Scaly: No Temperature / Pain Maceration: No Temperature: No Abnormality Tenderness on  Palpation: Yes Wound Preparation Ulcer Cleansing: Rinsed/Irrigated with Saline Topical Anesthetic Applied: Other: lidocaine 4%, Treatment Notes Wound #1 (Left Forearm) 1. Cleansed with: Clean wound with Normal Saline 2. Anesthetic Topical Lidocaine 4% cream to wound bed prior to debridement 4. Dressing Applied: Hydrafera Blue 5. Secondary Dressing Applied Kerlix/Conform Notes coban, drawtex Electronic Signature(s) Signed: 03/23/2017 2:23:24 PM By: Alric Quan Entered By: Alric Quan on 03/23/2017 14:23:23 [name], [name] ([id number]) -------------------------------------------------------------------------------- Vitals Details Patient Name: Trautman, Chasitie P. Date of Service: 03/23/2017 2:15 PM Medical Record Number: [id number] Patient Account Number: [id number] Date of Birth/Sex: 1916-11-29 (83 y.o. Female) Treating RN: [name] [name] Primary Care Malachai Schalk: [name] [name] Other Clinician: Referring Elisea Khader: [name] [name] Treating Grier Vu/Extender: [name] [name] in Treatment: 4 Vital Signs Time Taken: 14:12 Pulse (bpm): 73 Height (in): 63 Respiratory Rate (breaths/min): 16 Weight (lbs): 149 Blood Pressure (mmHg): 142/50 Body Mass Index (BMI): 26.4 Reference Range: 80 - 120 mg / dl Electronic Signature(s) Signed: 03/23/2017 2:54:02 PM By: Alric Quan Entered By: Alric Quan on 03/23/2017 14:15:23
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
## 2017-03-30 ENCOUNTER — Encounter: Payer: Medicare Other | Attending: Surgery | Admitting: Surgery

## 2017-03-30 DIAGNOSIS — Z923 Personal history of irradiation: Secondary | ICD-10-CM | POA: Diagnosis not present

## 2017-03-30 DIAGNOSIS — Z853 Personal history of malignant neoplasm of breast: Secondary | ICD-10-CM | POA: Diagnosis not present

## 2017-03-30 DIAGNOSIS — E785 Hyperlipidemia, unspecified: Secondary | ICD-10-CM | POA: Insufficient documentation

## 2017-03-30 DIAGNOSIS — S51802A Unspecified open wound of left forearm, initial encounter: Secondary | ICD-10-CM | POA: Insufficient documentation

## 2017-03-30 DIAGNOSIS — D649 Anemia, unspecified: Secondary | ICD-10-CM | POA: Insufficient documentation

## 2017-03-30 DIAGNOSIS — X58XXXA Exposure to other specified factors, initial encounter: Secondary | ICD-10-CM | POA: Insufficient documentation

## 2017-03-30 DIAGNOSIS — M1990 Unspecified osteoarthritis, unspecified site: Secondary | ICD-10-CM | POA: Diagnosis not present

## 2017-03-30 DIAGNOSIS — I10 Essential (primary) hypertension: Secondary | ICD-10-CM | POA: Insufficient documentation

## 2017-04-01 NOTE — Progress Notes (Signed)
[name], [name] ([id number]) Visit Report for 03/30/2017 Arrival Information Details Patient Name: Athanas, [name] Date of Service: 03/30/2017 2:30 PM Medical Record Number: [id number] Patient Account Number: [id number] Date of Birth/Sex: 08-30-1916 (83 y.o. Female) Treating RN: Montey Hora Primary Care Aline Wesche: Raechel Ache, DAVID Other Clinician: Referring Paelyn Smick: Raechel Ache, DAVID Treating Viraaj Vorndran/Extender: Frann Rider in Treatment: 5 Visit Information History Since Last Visit Added or deleted any medications: No Patient Arrived: Cane Any new allergies or adverse reactions: No Arrival Time: 14:35 Had a fall or experienced change in No Accompanied By: dtr activities of daily living that may affect Transfer Assistance: None risk of falls: Patient Identification Verified: Yes Signs or symptoms of abuse/neglect since last visito No Secondary Verification Process Completed: Yes Hospitalized since last visit: No Patient Requires Transmission-Based Precautions: No Has Dressing in Place as Prescribed: Yes Patient Has Alerts: No Pain Present Now: No Electronic Signature(s) Signed: 03/30/2017 4:53:14 PM By: Montey Hora Entered By: Montey Hora on 03/30/2017 14:39:41 [place]ton, Vangie P. ([id number]) -------------------------------------------------------------------------------- Encounter Discharge Information Details Patient Name: Mcphee, [name] Date of Service: 03/30/2017 2:30 PM Medical Record Number: [id number] Patient Account Number: [id number] Date of Birth/Sex: [birth year] (83 y.o. Female) Treating RN: Montey Hora Primary Care Alyissa Whidbee: Raechel Ache, DAVID Other Clinician: Referring Geovani Tootle: Raechel Ache, DAVID Treating Chanetta Moosman/Extender: Frann Rider in Treatment: 5 Encounter Discharge Information Items Discharge Pain Level: 0 Discharge Condition: Stable Ambulatory Status: Cane Discharge Destination: Home Transportation: Private Auto Accompanied By: dtr Schedule  Follow-up Appointment: Yes Medication Reconciliation completed and No provided to Patient/Care Maryah Marinaro: Provided on Clinical Summary of Care: 03/30/2017 Form Type Recipient Paper Patient IM Electronic Signature(s) Signed: 03/30/2017 3:56:38 PM By: Montey Hora Entered By: Montey Hora on 03/30/2017 15:56:38 Flagler, Ashantae P. ([id number]) -------------------------------------------------------------------------------- Multi Wound Chart Details Patient Name: Mori, [name] Date of Service: 03/30/2017 2:30 PM Medical Record Number: [id number] Patient Account Number: [id number] Date of Birth/Sex: 09-Jun-1916 (83 y.o. Female) Treating RN: Montey Hora Primary Care Colan Laymon: Raechel Ache, DAVID Other Clinician: Referring Merelyn Klump: Raechel Ache, DAVID Treating Tondalaya Perren/Extender: Frann Rider in Treatment: 5 Vital Signs Height(in): 63 Pulse(bpm): 8 Weight(lbs): 149 Blood Pressure(mmHg): 145/53 Body Mass Index(BMI): 26 Temperature(F): 97.7 Respiratory Rate 16 (breaths/min): Photos: [1:No Photos] [N/A:N/A] Wound Location: [1:Left Forearm] [N/A:N/A] Wounding Event: [1:Trauma] [N/A:N/A] Primary Etiology: [1:Skin Tear] [N/A:N/A] Comorbid History: [1:Cataracts, Anemia, Hypertension, Osteoarthritis, Received Radiation] [N/A:N/A] Date Acquired: [1:02/03/2017] [N/A:N/A] Weeks of Treatment: [1:5] [N/A:N/A] Wound Status: [1:Open] [N/A:N/A] Measurements L x W x D [1:3.5x1.6x0.1] [N/A:N/A] (cm) Area (cm) : [1:4.398] [N/A:N/A] Volume (cm) : [1:0.44] [N/A:N/A] % Reduction in Area: [1:70.90%] [N/A:N/A] % Reduction in Volume: [1:70.90%] [N/A:N/A] Classification: [1:Full Thickness With Exposed Support Structures] [N/A:N/A] Exudate Amount: [1:Large] [N/A:N/A] Exudate Type: [1:Sanguinous] [N/A:N/A] Exudate Color: [1:red] [N/A:N/A] Wound Margin: [1:Flat and Intact] [N/A:N/A] Granulation Amount: [1:Large (67-100%)] [N/A:N/A] Granulation Quality: [1:Red, Hyper-granulation]  [N/A:N/A] Necrotic Amount: [1:None Present (0%)] [N/A:N/A] Exposed Structures: [1:Fat Layer (Subcutaneous Tissue) Exposed: Yes Fascia: No Tendon: No Muscle: No Joint: No Bone: No] [N/A:N/A] Epithelialization: [1:Small (1-33%)] [N/A:N/A] Periwound Skin Texture: [1:Excoriation: No Induration: No Callus: No Crepitus: No] [N/A:N/A] Rash: No Scarring: No Periwound Skin Moisture: Maceration: No N/A N/A Dry/Scaly: No Periwound Skin Color: Atrophie Blanche: No N/A N/A Cyanosis: No Ecchymosis: No Erythema: No Hemosiderin Staining: No Mottled: No Pallor: No Rubor: No Temperature: No Abnormality N/A N/A Tenderness on Palpation: Yes N/A N/A Wound Preparation: Ulcer Cleansing: N/A N/A Rinsed/Irrigated with Saline Topical Anesthetic Applied: Other: lidocaine 4% Procedures Performed: CHEM CAUT GRANULATION N/A N/A TISS Treatment Notes Electronic Signature(s) Signed: 03/30/2017 3:11:05 PM By: Con Memos,  Roderick Pee MD, FACS Entered By: Christin Fudge on 03/30/2017 15:11:05 Feagans, Prentiss P. ([id number]) -------------------------------------------------------------------------------- Multi-Disciplinary Care Plan Details Patient Name: Hammock, Callyn P. Date of Service: 03/30/2017 2:30 PM Medical Record Number: [id number] Patient Account Number: [id number] Date of Birth/Sex: 10-01-1916 (83 y.o. Female) Treating RN: Montey Hora Primary Care Markel Mergenthaler: Raechel Ache, DAVID Other Clinician: Referring Ankit Degregorio: Raechel Ache, DAVID Treating Karyme Mcconathy/Extender: Frann Rider in Treatment: 5 Active Inactive ` Abuse / Safety / Falls / Self Care Management Nursing Diagnoses: Impaired physical mobility Potential for falls Goals: Patient will not experience any injury related to falls Date Initiated: 02/17/2017 Target Resolution Date: 04/29/2017 Goal Status: Active Interventions: Assess fall risk on admission and as needed Notes: ` Orientation to the Wound Care Program Nursing Diagnoses: Knowledge deficit  related to the wound healing center program Goals: Patient/caregiver will verbalize understanding of the [place] Date Initiated: 02/17/2017 Target Resolution Date: 04/29/2017 Goal Status: Active Interventions: Provide education on orientation to the wound center Notes: ` Wound/Skin Impairment Nursing Diagnoses: Impaired tissue integrity Goals: Ulcer/skin breakdown will heal within 14 weeks Date Initiated: 02/17/2017 Target Resolution Date: 04/29/2017 Goal Status: Active Burdick, Anastasija P. ([id number]) Interventions: Assess patient/caregiver ability to obtain necessary supplies Assess patient/caregiver ability to perform ulcer/skin care regimen upon admission and as needed Assess ulceration(s) every visit Notes: Electronic Signature(s) Signed: 03/30/2017 4:53:14 PM By: Montey Hora Entered By: Montey Hora on 03/30/2017 14:45:55 Sterkel, Jada P. ([id number]) -------------------------------------------------------------------------------- Pain Assessment Details Patient Name: Bobak, Connee P. Date of Service: 03/30/2017 2:30 PM Medical Record Number: [id number] Patient Account Number: [id number] Date of Birth/Sex: November 28, 1916 (83 y.o. Female) Treating RN: Montey Hora Primary Care Therma Lasure: Raechel Ache, DAVID Other Clinician: Referring Sonya Gunnoe: Raechel Ache, DAVID Treating Akshara Blumenthal/Extender: Frann Rider in Treatment: 5 Active Problems Location of Pain Severity and Description of Pain Patient Has Paino No Site Locations Pain Management and Medication Current Pain Management: Electronic Signature(s) Signed: 03/30/2017 4:53:14 PM By: Montey Hora Entered By: Montey Hora on 03/30/2017 14:39:50 Madill, Etola P. ([id number]) -------------------------------------------------------------------------------- Patient/Caregiver Education Details Patient Name: Bleiler, Melainie P. Date of Service: 03/30/2017 2:30 PM Medical Record Number: [id number] Patient  Account Number: [id number] Date of Birth/Gender: 30-Jul-1916 (83 y.o. Female) Treating RN: Montey Hora Primary Care Physician: Raechel Ache, DAVID Other Clinician: Referring Physician: Raechel Ache, DAVID Treating Physician/Extender: Frann Rider in Treatment: 5 Education Assessment Education Provided To: Patient and Caregiver Education Topics Provided Wound/Skin Impairment: Handouts: Other: wound care as ordered Methods: Demonstration, Explain/Verbal Responses: State content correctly Electronic Signature(s) Signed: 03/30/2017 4:53:14 PM By: Montey Hora Entered By: Montey Hora on 03/30/2017 15:57:30 Jagger, Kynsli P. ([id number]) -------------------------------------------------------------------------------- Wound Assessment Details Patient Name: Enzor, Stellah P. Date of Service: 03/30/2017 2:30 PM Medical Record Number: [id number] Patient Account Number: [id number] Date of Birth/Sex: 1917/04/25 (83 y.o. Female) Treating RN: Montey Hora Primary Care Jessieca Rhem: Raechel Ache, DAVID Other Clinician: Referring Avryl Roehm: Raechel Ache, DAVID Treating Guillaume Weninger/Extender: Frann Rider in Treatment: 5 Wound Status Wound Number: 1 Primary Skin Tear Etiology: Wound Location: Left Forearm Wound Open Wounding Event: Trauma Status: Date Acquired: 02/03/2017 Comorbid Cataracts, Anemia, Hypertension, Weeks Of Treatment: 5 History: Osteoarthritis, Received Radiation Clustered Wound: No Photos Photo Uploaded By: Montey Hora on 03/30/2017 15:59:29 Wound Measurements Length: (cm) 3.5 Width: (cm) 1.6 Depth: (cm) 0.1 Area: (cm) 4.398 Volume: (cm) 0.44 % Reduction in Area: 70.9% % Reduction in Volume: 70.9% Epithelialization: Small (1-33%) Tunneling: No Undermining: No Wound Description Full Thickness With Exposed Support Classification: Structures Wound Margin: Flat and Intact Exudate Large Amount: Exudate Type: Sanguinous Exudate Color: red Foul Odor After Cleansing:  No Slough/Fibrino Yes Wound Bed Granulation Amount: Large (67-100%) Exposed Structure Granulation Quality: Red, Hyper-granulation Fascia Exposed: No Necrotic Amount: None Present (0%) Fat Layer (Subcutaneous Tissue) Exposed: Yes Tendon Exposed: No Muscle Exposed: No Joint Exposed: No Bone Exposed: No Hanak, Christean P. ([id number]) Periwound Skin Texture Texture Color No Abnormalities Noted: No No Abnormalities Noted: No Callus: No Atrophie Blanche: No Crepitus: No Cyanosis: No Excoriation: No Ecchymosis: No Induration: No Erythema: No Rash: No Hemosiderin Staining: No Scarring: No Mottled: No Pallor: No Moisture Rubor: No No Abnormalities Noted: No Dry / Scaly: No Temperature / Pain Maceration: No Temperature: No Abnormality Tenderness on Palpation: Yes Wound Preparation Ulcer Cleansing: Rinsed/Irrigated with Saline Topical Anesthetic Applied: Other: lidocaine 4%, Treatment Notes Wound #1 (Left Forearm) 1. Cleansed with: Clean wound with Normal Saline 2. Anesthetic Topical Lidocaine 4% cream to wound bed prior to debridement 4. Dressing Applied: Hydrafera Blue Other dressing (specify in notes) 5. Secondary Dressing Applied Kerlix/Conform Notes coban, drawtex Electronic Signature(s) Signed: 03/30/2017 4:53:14 PM By: Montey Hora Entered By: Montey Hora on 03/30/2017 14:45:43 Greenman, Hadleigh P. ([id number]) -------------------------------------------------------------------------------- Vitals Details Patient Name: Rath, Aeron P. Date of Service: 03/30/2017 2:30 PM Medical Record Number: [id number] Patient Account Number: [id number] Date of Birth/Sex: [birth year] (83 y.o. Female) Treating RN: Montey Hora Primary Care Fitz Matsuo: Raechel Ache, DAVID Other Clinician: Referring Christopherjame Carnell: Raechel Ache, DAVID Treating Carter Kaman/Extender: Frann Rider in Treatment: 5 Vital Signs Time Taken: 14:39 Temperature (F): 97.7 Height (in): 63 Pulse (bpm):  71 Weight (lbs): 149 Respiratory Rate (breaths/min): 16 Body Mass Index (BMI): 26.4 Blood Pressure (mmHg): 145/53 Reference Range: 80 - 120 mg / dl Electronic Signature(s) Signed: 03/30/2017 4:53:14 PM By: Montey Hora Entered By: Montey Hora on 03/30/2017 14:40:17
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
## 2017-04-06 ENCOUNTER — Encounter: Payer: Medicare Other | Admitting: Surgery

## 2017-04-06 DIAGNOSIS — S51802A Unspecified open wound of left forearm, initial encounter: Secondary | ICD-10-CM | POA: Diagnosis not present
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
## 2017-04-11 NOTE — Progress Notes (Signed)
LENOX, LADOUCEUR ([id number]) Visit Report for 04/06/2017 Arrival Information Details Patient Name: Fallin, Alaynna P. Date of Service: 04/06/2017 1:30 PM Medical Record Number: [id number] Patient Account Number: [id number] Date of Birth/Sex: [birth year] (83 y.o. Female) Treating RN: Carolyne Fiscal, Debi Primary Care Lillyrose Reitan: Raechel Ache, DAVID Other Clinician: Referring Margeret Stachnik: Raechel Ache, DAVID Treating Ellan Tess/Extender: Frann Rider in Treatment: 6 Visit Information History Since Last Visit All ordered tests and consults were completed: No Patient Arrived: Kasandra Knudsen Added or deleted any medications: No Arrival Time: 13:41 Any new allergies or adverse reactions: No Accompanied By: daughter Had a fall or experienced change in No Transfer Assistance: EasyPivot Patient activities of daily living that may affect Lift risk of falls: Patient Identification Verified: Yes Signs or symptoms of abuse/neglect since last visito No Secondary Verification Process Yes Hospitalized since last visit: No Completed: Has Dressing in Place as Prescribed: Yes Patient Requires Transmission-Based No Precautions: Pain Present Now: No Patient Has Alerts: No Electronic Signature(s) Signed: 04/06/2017 4:48:36 PM By: Alric Quan Entered By: Alric Quan on 04/06/2017 13:41:47 Antwi, Shakenya P. ([id number]) -------------------------------------------------------------------------------- Encounter Discharge Information Details Patient Name: Wahba, Kailei P. Date of Service: 04/06/2017 1:30 PM Medical Record Number: [id number] Patient Account Number: [id number] Date of Birth/Sex: 09/15/1916 (83 y.o. Female) Treating RN: Carolyne Fiscal, Debi Primary Care Stephie Xu: Raechel Ache, DAVID Other Clinician: Referring Lilianne Delair: Raechel Ache, DAVID Treating Dondrell Loudermilk/Extender: Frann Rider in Treatment: 6 Encounter Discharge Information Items Discharge Pain Level: 0 Discharge Condition: Stable Ambulatory Status:  Cane Discharge Destination: Home Transportation: Private Auto Accompanied By: daughter Schedule Follow-up Appointment: Yes Medication Reconciliation completed and No provided to Patient/Care Katti Pelle: Provided on Clinical Summary of Care: 04/06/2017 Form Type Recipient Paper Patient IM Electronic Signature(s) Signed: 04/11/2017 11:15:46 AM By: Ruthine Dose Entered By: Ruthine Dose on 04/06/2017 14:10:08 Hiscox, Laelani P. ([id number]) -------------------------------------------------------------------------------- Lower Extremity Assessment Details Patient Name: Blankenhorn, Sophiea P. Date of Service: 04/06/2017 1:30 PM Medical Record Number: [id number] Patient Account Number: [id number] Date of Birth/Sex: 1916-05-15 (83 y.o. Female) Treating RN: Ahmed Prima Primary Care Aaima Gaddie: Raechel Ache, DAVID Other Clinician: Referring Apolo Cutshaw: Raechel Ache, DAVID Treating Irmgard Rampersaud/Extender: Frann Rider in Treatment: 6 Electronic Signature(s) Signed: 04/06/2017 4:48:36 PM By: Alric Quan Entered By: Alric Quan on 04/06/2017 13:51:21 Ernster, Shamona P. ([id number]) -------------------------------------------------------------------------------- Multi Wound Chart Details Patient Name: Hadley, Kadiatou P. Date of Service: 04/06/2017 1:30 PM Medical Record Number: [id number] Patient Account Number: [id number] Date of Birth/Sex: March 27, 1917 (83 y.o. Female) Treating RN: Carolyne Fiscal, Debi Primary Care Tkeyah Burkman: Raechel Ache, DAVID Other Clinician: Referring Damarrion Mimbs: Raechel Ache, DAVID Treating Quintus Premo/Extender: Frann Rider in Treatment: 6 Vital Signs Height(in): 63 Pulse(bpm): 78 Weight(lbs): 149 Blood Pressure(mmHg): 148/47 Body Mass Index(BMI): 26 Temperature(F): Respiratory Rate 16 (breaths/min): Photos: [1:No Photos] [N/A:N/A] Wound Location: [1:Left Forearm] [N/A:N/A] Wounding Event: [1:Trauma] [N/A:N/A] Primary Etiology: [1:Skin Tear] [N/A:N/A] Comorbid History:  [1:Cataracts, Anemia, Hypertension, Osteoarthritis, Received Radiation] [N/A:N/A] Date Acquired: [1:02/03/2017] [N/A:N/A] Weeks of Treatment: [1:6] [N/A:N/A] Wound Status: [1:Open] [N/A:N/A] Measurements L x W x D [1:2x0.7x0.1] [N/A:N/A] (cm) Area (cm) : [1:1.1] [N/A:N/A] Volume (cm) : [1:0.11] [N/A:N/A] % Reduction in Area: [1:92.70%] [N/A:N/A] % Reduction in Volume: [1:92.70%] [N/A:N/A] Classification: [1:Full Thickness With Exposed Support Structures] [N/A:N/A] Exudate Amount: [1:Large] [N/A:N/A] Exudate Type: [1:Sanguinous] [N/A:N/A] Exudate Color: [1:red] [N/A:N/A] Wound Margin: [1:Flat and Intact] [N/A:N/A] Granulation Amount: [1:Large (67-100%)] [N/A:N/A] Granulation Quality: [1:Red, Hyper-granulation] [N/A:N/A] Necrotic Amount: [1:Small (1-33%)] [N/A:N/A] Exposed Structures: [1:Fat Layer (Subcutaneous Tissue) Exposed: Yes Fascia: No Tendon: No Muscle: No Joint: No Bone: No] [N/A:N/A] Epithelialization: [1:Small (1-33%)] [N/A:N/A] Periwound Skin Texture: [1:Excoriation: No Induration: No Callus: No Crepitus:  No] [N/A:N/A] Rash: No Scarring: No Periwound Skin Moisture: Maceration: No N/A N/A Dry/Scaly: No Periwound Skin Color: Atrophie Blanche: No N/A N/A Cyanosis: No Ecchymosis: No Erythema: No Hemosiderin Staining: No Mottled: No Pallor: No Rubor: No Temperature: No Abnormality N/A N/A Tenderness on Palpation: Yes N/A N/A Wound Preparation: Ulcer Cleansing: N/A N/A Rinsed/Irrigated with Saline Topical Anesthetic Applied: Other: lidocaine 4% Procedures Performed: CHEM CAUT GRANULATION N/A N/A TISS Treatment Notes Wound #1 (Left Forearm) 1. Cleansed with: Clean wound with Normal Saline 2. Anesthetic Topical Lidocaine 4% cream to wound bed prior to debridement 4. Dressing Applied: Hydrafera Blue 5. Secondary Dressing Applied Kerlix/Conform 7. Secured with Tape Notes coban, drawtex Electronic Signature(s) Signed: 04/06/2017 2:20:10 PM By: Christin Fudge MD, FACS Entered By: Christin Fudge on 04/06/2017 14:20:09 Mears, Deandria P. ([id number]) -------------------------------------------------------------------------------- [place] Details Patient Name: Seto, Luv P. Date of Service: 04/06/2017 1:30 PM Medical Record Number: [id number] Patient Account Number: [id number] Date of Birth/Sex: 1916/08/09 (83 y.o. Female) Treating RN: Carolyne Fiscal, Debi Primary Care Sajad Glander: Raechel Ache, DAVID Other Clinician: Referring Hall Birchard: Raechel Ache, DAVID Treating Cayley Pester/Extender: Frann Rider in Treatment: 6 Active Inactive ` Abuse / Safety / Falls / Self Care Management Nursing Diagnoses: Impaired physical mobility Potential for falls Goals: Patient will not experience any injury related to falls Date Initiated: 02/17/2017 Target Resolution Date: 04/29/2017 Goal Status: Active Interventions: Assess fall risk on admission and as needed Notes: ` Orientation to the Wound Care Program Nursing Diagnoses: Knowledge deficit related to the wound healing center program Goals: Patient/caregiver will verbalize understanding of the [place] Date Initiated: 02/17/2017 Target Resolution Date: 04/29/2017 Goal Status: Active Interventions: Provide education on orientation to the wound center Notes: ` Wound/Skin Impairment Nursing Diagnoses: Impaired tissue integrity Goals: Ulcer/skin breakdown will heal within 14 weeks Date Initiated: 02/17/2017 Target Resolution Date: 04/29/2017 Goal Status: Active Rosekrans, Dylin P. ([id number]) Interventions: Assess patient/caregiver ability to obtain necessary supplies Assess patient/caregiver ability to perform ulcer/skin care regimen upon admission and as needed Assess ulceration(s) every visit Notes: Electronic Signature(s) Signed: 04/06/2017 4:48:36 PM By: Alric Quan Entered By: Alric Quan on 04/06/2017 13:51:25 Mcnee, Betheny P.  ([id number]) -------------------------------------------------------------------------------- Pain Assessment Details Patient Name: Leite, Luvina P. Date of Service: 04/06/2017 1:30 PM Medical Record Number: [id number] Patient Account Number: [id number] Date of Birth/Sex: December 09, 1916 (83 y.o. Female) Treating RN: Carolyne Fiscal, Debi Primary Care Dantonio Justen: Raechel Ache, DAVID Other Clinician: Referring Inza Mikrut: Raechel Ache, DAVID Treating Beryl Hornberger/Extender: Frann Rider in Treatment: 6 Active Problems Location of Pain Severity and Description of Pain Patient Has Paino No Site Locations Pain Management and Medication Current Pain Management: Electronic Signature(s) Signed: 04/06/2017 4:48:36 PM By: Alric Quan Entered By: Alric Quan on 04/06/2017 13:41:53 Glacken, Sumedha P. ([id number]) -------------------------------------------------------------------------------- Patient/Caregiver Education Details Patient Name: Holton, Morghan P. Date of Service: 04/06/2017 1:30 PM Medical Record Number: [id number] Patient Account Number: [id number] Date of Birth/Gender: 1917/01/11 (83 y.o. Female) Treating RN: Carolyne Fiscal, Debi Primary Care Physician: Raechel Ache, DAVID Other Clinician: Referring Physician: Raechel Ache, DAVID Treating Physician/Extender: Frann Rider in Treatment: 6 Education Assessment Education Provided To: Patient Education Topics Provided Wound/Skin Impairment: Handouts: Other: change dressing as ordered Methods: Demonstration, Explain/Verbal Responses: State content correctly Electronic Signature(s) Signed: 04/06/2017 4:48:36 PM By: Alric Quan Entered By: Alric Quan on 04/06/2017 14:00:59 Cutler, Felicity P. ([id number]) -------------------------------------------------------------------------------- Wound Assessment Details Patient Name: Ransier, Athleen P. Date of Service: 04/06/2017 1:30 PM Medical Record Number: [id number] Patient Account Number:  [id number] Date of Birth/Sex: 04-Jul-1916 (83 y.o. Female) Treating RN: Ahmed Prima Primary Care Shareece Bultman: Raechel Ache,  DAVID Other Clinician: Referring Earsie Humm: Raechel Ache, DAVID Treating Vernon Ariel/Extender: Frann Rider in Treatment: 6 Wound Status Wound Number: 1 Primary Skin Tear Etiology: Wound Location: Left Forearm Wound Open Wounding Event: Trauma Status: Date Acquired: 02/03/2017 Comorbid Cataracts, Anemia, Hypertension, Weeks Of Treatment: 6 History: Osteoarthritis, Received Radiation Clustered Wound: No Photos Photo Uploaded By: Alric Quan on 04/06/2017 16:56:17 Wound Measurements Length: (cm) 2 Width: (cm) 0.7 Depth: (cm) 0.1 Area: (cm) 1.1 Volume: (cm) 0.11 % Reduction in Area: 92.7% % Reduction in Volume: 92.7% Epithelialization: Small (1-33%) Tunneling: No Undermining: No Wound Description Full Thickness With Exposed Support Classification: Structures Wound Margin: Flat and Intact Exudate Large Amount: Exudate Type: Sanguinous Exudate Color: red Foul Odor After Cleansing: No Slough/Fibrino Yes Wound Bed Granulation Amount: Large (67-100%) Exposed Structure Granulation Quality: Red, Hyper-granulation Fascia Exposed: No Necrotic Amount: Small (1-33%) Fat Layer (Subcutaneous Tissue) Exposed: Yes Necrotic Quality: Adherent Slough Tendon Exposed: No Muscle Exposed: No Joint Exposed: No Bone Exposed: No Weidner, Delisa P. ([id number]) Periwound Skin Texture Texture Color No Abnormalities Noted: No No Abnormalities Noted: No Callus: No Atrophie Blanche: No Crepitus: No Cyanosis: No Excoriation: No Ecchymosis: No Induration: No Erythema: No Rash: No Hemosiderin Staining: No Scarring: No Mottled: No Pallor: No Moisture Rubor: No No Abnormalities Noted: No Dry / Scaly: No Temperature / Pain Maceration: No Temperature: No Abnormality Tenderness on Palpation: Yes Wound Preparation Ulcer Cleansing: Rinsed/Irrigated with  Saline Topical Anesthetic Applied: Other: lidocaine 4%, Treatment Notes Wound #1 (Left Forearm) 1. Cleansed with: Clean wound with Normal Saline 2. Anesthetic Topical Lidocaine 4% cream to wound bed prior to debridement 4. Dressing Applied: Hydrafera Blue 5. Secondary Dressing Applied Kerlix/Conform 7. Secured with Tape Notes coban, drawtex Electronic Signature(s) Signed: 04/06/2017 4:48:36 PM By: Alric Quan Entered By: Alric Quan on 04/06/2017 13:49:32 Sarno, Garrie P. ([id number]) -------------------------------------------------------------------------------- Vitals Details Patient Name: Santori, Kira P. Date of Service: 04/06/2017 1:30 PM Medical Record Number: [id number] Patient Account Number: [id number] Date of Birth/Sex: 06-04-1916 (83 y.o. Female) Treating RN: Carolyne Fiscal, Debi Primary Care Willistine Ferrall: Raechel Ache, DAVID Other Clinician: Referring Alora Gorey: Raechel Ache, DAVID Treating Evelette Hollern/Extender: Frann Rider in Treatment: 6 Vital Signs Time Taken: 13:41 Pulse (bpm): 69 Height (in): 63 Respiratory Rate (breaths/min): 16 Weight (lbs): 149 Blood Pressure (mmHg): 148/47 Body Mass Index (BMI): 26.4 Reference Range: 80 - 120 mg / dl Electronic Signature(s) Signed: 04/06/2017 4:48:36 PM By: Alric Quan Entered By: Alric Quan on 04/06/2017 13:51:13

## 2017-04-20 ENCOUNTER — Encounter: Payer: Medicare Other | Admitting: Surgery

## 2017-04-20 DIAGNOSIS — S51802A Unspecified open wound of left forearm, initial encounter: Secondary | ICD-10-CM | POA: Diagnosis not present
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
## 2017-04-22 NOTE — Progress Notes (Signed)
HARLIE, BUENING ([id number]) Visit Report for 04/20/2017 Arrival Information Details Patient Name: [name] [name], [name] Date of Service: 04/20/2017 2:15 PM Medical Record Number: [id number] Patient Account Number: [id number] Date of Birth/Sex: 1916/05/02 (83 y.o. Female) Treating RN: Carolyne Fiscal, Debi Primary Care Vashon Riordan: Raechel Ache, DAVID Other Clinician: Referring Jalik Gellatly: Raechel Ache, DAVID Treating Paxton Kanaan/Extender: Frann Rider in Treatment: 8 Visit Information History Since Last Visit All ordered tests and consults were completed: No Patient Arrived: Kasandra Knudsen Added or deleted any medications: No Arrival Time: 14:35 Any new allergies or adverse reactions: No Accompanied By: daughter, son Had a fall or experienced change in No Transfer Assistance: EasyPivot Patient activities of daily living that may affect Lift risk of falls: Patient Identification Verified: Yes Signs or symptoms of abuse/neglect since last visito No Secondary Verification Process Yes Hospitalized since last visit: No Completed: Has Dressing in Place as Prescribed: Yes Patient Requires Transmission-Based No Precautions: Pain Present Now: No Patient Has Alerts: No Electronic Signature(s) Signed: 04/20/2017 4:45:08 PM By: Alric Quan Entered By: Alric Quan on 04/20/2017 14:37:11 Druckenmiller, Monchel P. ([id number]) -------------------------------------------------------------------------------- Clinic Level of Care Assessment Details Patient Name: [name] [name], [name] Date of Service: 04/20/2017 2:15 PM Medical Record Number: [id number] Patient Account Number: [id number] Date of Birth/Sex: 1916/04/29 (83 y.o. Female) Treating RN: Carolyne Fiscal, Debi Primary Care Petra Dumler: Raechel Ache, DAVID Other Clinician: Referring Rayvn Rickerson: Raechel Ache, DAVID Treating Breanda Greenlaw/Extender: Frann Rider in Treatment: 8 Clinic Level of Care Assessment Items TOOL 4 Quantity Score X - Use when only an EandM is performed on FOLLOW-UP  visit 1 0 ASSESSMENTS - Nursing Assessment / Reassessment X - Reassessment of Co-morbidities (includes updates in patient status) 1 10 X- 1 5 Reassessment of Adherence to Treatment Plan ASSESSMENTS - Wound and Skin Assessment / Reassessment X - Simple Wound Assessment / Reassessment - one wound 1 5 []  - 0 Complex Wound Assessment / Reassessment - multiple wounds []  - 0 Dermatologic / Skin Assessment (not related to wound area) ASSESSMENTS - Focused Assessment []  - Circumferential Edema Measurements - multi extremities 0 []  - 0 Nutritional Assessment / Counseling / Intervention []  - 0 Lower Extremity Assessment (monofilament, tuning fork, pulses) []  - 0 Peripheral Arterial Disease Assessment (using hand held doppler) ASSESSMENTS - Ostomy and/or Continence Assessment and Care []  - Incontinence Assessment and Management 0 []  - 0 Ostomy Care Assessment and Management (repouching, etc.) PROCESS - Coordination of Care X - Simple Patient / Family Education for ongoing care 1 15 []  - 0 Complex (extensive) Patient / Family Education for ongoing care []  - 0 Staff obtains Programmer, systems, Records, Test Results / Process Orders []  - 0 Staff telephones HHA, Nursing Homes / Clarify orders / etc []  - 0 Routine Transfer to another Facility (non-emergent condition) []  - 0 Routine Hospital Admission (non-emergent condition) []  - 0 New Admissions / Biomedical engineer / Ordering NPWT, Apligraf, etc. []  - 0 Emergency Hospital Admission (emergent condition) X- 1 10 Simple Discharge Coordination Sarracino, Errica P. ([id number]) []  - 0 Complex (extensive) Discharge Coordination PROCESS - Special Needs []  - Pediatric / Minor Patient Management 0 []  - 0 Isolation Patient Management []  - 0 Hearing / Language / Visual special needs []  - 0 Assessment of Community assistance (transportation, D/C planning, etc.) []  - 0 Additional assistance / Altered mentation []  - 0 Support Surface(s)  Assessment (bed, cushion, seat, etc.) INTERVENTIONS - Wound Cleansing / Measurement X - Simple Wound Cleansing - one wound 1 5 []  - 0 Complex Wound Cleansing - multiple wounds X- 1 5 Wound Imaging (  photographs - any number of wounds) []  - 0 Wound Tracing (instead of photographs) X- 1 5 Simple Wound Measurement - one wound []  - 0 Complex Wound Measurement - multiple wounds INTERVENTIONS - Wound Dressings X - Small Wound Dressing one or multiple wounds 1 10 []  - 0 Medium Wound Dressing one or multiple wounds []  - 0 Large Wound Dressing one or multiple wounds X- 1 5 Application of Medications - topical []  - 0 Application of Medications - injection INTERVENTIONS - Miscellaneous []  - External ear exam 0 []  - 0 Specimen Collection (cultures, biopsies, blood, body fluids, etc.) []  - 0 Specimen(s) / Culture(s) sent or taken to Lab for analysis []  - 0 Patient Transfer (multiple staff / Civil Service fast streamer / Similar devices) []  - 0 Simple Staple / Suture removal (25 or less) []  - 0 Complex Staple / Suture removal (26 or more) []  - 0 Hypo / Hyperglycemic Management (close monitor of Blood Glucose) []  - 0 Ankle / Brachial Index (ABI) - do not check if billed separately X- 1 5 Vital Signs [name] [name], [name] ([id number]) Has the patient been seen at the hospital within the last three years: Yes Total Score: 80 Level Of Care: New/Established - Level 3 Electronic Signature(s) Signed: 04/20/2017 4:45:08 PM By: Alric Quan Entered By: Alric Quan on 04/20/2017 16:11:34 [name] [name], [name] ([id number]) -------------------------------------------------------------------------------- Encounter Discharge Information Details Patient Name: [name] [name], [name] Date of Service: 04/20/2017 2:15 PM Medical Record Number: [id number] Patient Account Number: [id number] Date of Birth/Sex: [birth year] (83 y.o. Female) Treating RN: Carolyne Fiscal, Debi Primary Care France Noyce: Raechel Ache, DAVID Other  Clinician: Referring Cienna Dumais: Raechel Ache, DAVID Treating Nico Syme/Extender: Frann Rider in Treatment: 8 Encounter Discharge Information Items Discharge Pain Level: 0 Discharge Condition: Stable Ambulatory Status: Cane Discharge Destination: Home Transportation: Private Auto Accompanied By: daughter, son Schedule Follow-up Appointment: No Medication Reconciliation completed and No provided to Patient/Care Vandora Jaskulski: Provided on Clinical Summary of Care: 04/20/2017 Form Type Recipient Paper Patient IM Electronic Signature(s) Signed: 04/21/2017 2:37:55 PM By: Ruthine Dose Entered By: Ruthine Dose on 04/20/2017 15:17:35 [name] [name], [name] ([id number]) -------------------------------------------------------------------------------- Lower Extremity Assessment Details Patient Name: Thelander, Tariya P. Date of Service: 04/20/2017 2:15 PM Medical Record Number: [id number] Patient Account Number: [id number] Date of Birth/Sex: 1916-06-07 (83 y.o. Female) Treating RN: Carolyne Fiscal, Debi Primary Care Oddis Westling: Raechel Ache, DAVID Other Clinician: Referring Malita Ignasiak: Raechel Ache, DAVID Treating Ivoree Felmlee/Extender: Frann Rider in Treatment: 8 Edema Assessment Assessed: [Left: No] [Right: No] [Left: Edema] [Right: :] Calf Left: Right: Point of Measurement: 31 cm From Medial Instep cm 41.5 cm Ankle Left: Right: Point of Measurement: 9 cm From Medial Instep cm 21.2 cm Vascular Assessment Pulses: Dorsalis Pedis Palpable: [Right:No] Posterior Tibial Extremity colors, hair growth, and conditions: Extremity Color: [Right:Normal] Hair Growth on Extremity: [Right:Yes] Temperature of Extremity: [Right:Warm] Capillary Refill: [Right:< 3 seconds] Blood Pressure: Brachial: [Right:188] Dorsalis Pedis: [Left:Dorsalis Pedis: 630] Ankle: Posterior Tibial: [Left:Posterior Tibial: 142] [Right:0.77] Toe Nail Assessment Left: Right: Thick: Yes Discolored: Yes Deformed: No Improper Length and  Hygiene: No Electronic Signature(s) Signed: 04/20/2017 4:45:08 PM By: Alric Quan Entered By: Alric Quan on 04/20/2017 14:59:33 [name] [name], [name] ([id number]) -------------------------------------------------------------------------------- Multi Wound Chart Details Patient Name: [name] [name], [name] Date of Service: 04/20/2017 2:15 PM Medical Record Number: [id number] Patient Account Number: [id number] Date of Birth/Sex: 1916/06/24 (83 y.o. Female) Treating RN: Carolyne Fiscal, Debi Primary Care Chania Kochanski: Raechel Ache, DAVID Other Clinician: Referring Rosamund Nyland: Raechel Ache, DAVID Treating Bahar Shelden/Extender: Frann Rider in Treatment: 8 Vital Signs Height(in): 63 Pulse(bpm): 64 Weight(lbs): 149 Blood Pressure(mmHg): 177/63 Body  Mass Index(BMI): 26 Temperature(F): Respiratory Rate 16 (breaths/min): Photos: [1:No Photos] [N/A:N/A] Wound Location: [1:Left Forearm] [N/A:N/A] Wounding Event: [1:Trauma] [N/A:N/A] Primary Etiology: [1:Skin Tear] [N/A:N/A] Comorbid History: [1:Cataracts, Anemia, Hypertension, Osteoarthritis, Received Radiation] [N/A:N/A] Date Acquired: [1:02/03/2017] [N/A:N/A] Weeks of Treatment: [1:8] [N/A:N/A] Wound Status: [1:Healed - Epithelialized] [N/A:N/A] Measurements L x W x D [1:0x0x0] [N/A:N/A] (cm) Area (cm) : [1:0] [N/A:N/A] Volume (cm) : [1:0] [N/A:N/A] % Reduction in Area: [1:100.00%] [N/A:N/A] % Reduction in Volume: [1:100.00%] [N/A:N/A] Classification: [1:Full Thickness With Exposed Support Structures] [N/A:N/A] Exudate Amount: [1:None Present] [N/A:N/A] Wound Margin: [1:Flat and Intact] [N/A:N/A] Granulation Amount: [1:None Present (0%)] [N/A:N/A] Necrotic Amount: [1:None Present (0%)] [N/A:N/A] Exposed Structures: [1:Fascia: No Fat Layer (Subcutaneous Tissue) Exposed: No Tendon: No Muscle: No Joint: No Bone: No] [N/A:N/A] Epithelialization: [1:Large (67-100%)] [N/A:N/A] Periwound Skin Texture: [1:Excoriation: No Induration: No Callus: No  Crepitus: No Rash: No Scarring: No] [N/A:N/A] Periwound Skin Moisture: [N/A:N/A] Maceration: No Dry/Scaly: No Periwound Skin Color: Atrophie Blanche: No N/A N/A Cyanosis: No Ecchymosis: No Erythema: No Hemosiderin Staining: No Mottled: No Pallor: No Rubor: No Temperature: No Abnormality N/A N/A Tenderness on Palpation: Yes N/A N/A Wound Preparation: Ulcer Cleansing: N/A N/A Rinsed/Irrigated with Saline Topical Anesthetic Applied: None Treatment Notes Electronic Signature(s) Signed: 04/20/2017 3:23:07 PM By: Christin Fudge MD, FACS Entered By: Christin Fudge on 04/20/2017 15:23:06 [name] [name], [name] ([id number]) -------------------------------------------------------------------------------- [place] Details Patient Name: Phimmasone, Tabia P. Date of Service: 04/20/2017 2:15 PM Medical Record Number: [id number] Patient Account Number: [id number] Date of Birth/Sex: [birth year] (83 y.o. Female) Treating RN: Ahmed Prima Primary Care Sharonne Ricketts: Raechel Ache, DAVID Other Clinician: Referring Darryl Willner: Raechel Ache, DAVID Treating Sian Joles/Extender: Frann Rider in Treatment: 8 Active Inactive Electronic Signature(s) Signed: 04/20/2017 4:45:08 PM By: Alric Quan Entered By: Alric Quan on 04/20/2017 15:08:37 [name] [name], [name] ([id number]) -------------------------------------------------------------------------------- Pain Assessment Details Patient Name: [name] [name], [name] Date of Service: 04/20/2017 2:15 PM Medical Record Number: [id number] Patient Account Number: [id number] Date of Birth/Sex: 07/31/1916 (83 y.o. Female) Treating RN: Carolyne Fiscal, Debi Primary Care Sabin Gibeault: Raechel Ache, DAVID Other Clinician: Referring Derica Leiber: Raechel Ache, DAVID Treating Aala Ransom/Extender: Frann Rider in Treatment: 8 Active Problems Location of Pain Severity and Description of Pain Patient Has Paino No Site Locations Pain Management and Medication Current Pain  Management: Electronic Signature(s) Signed: 04/20/2017 4:45:08 PM By: Alric Quan Entered By: Alric Quan on 04/20/2017 14:37:19 [name] [name], [name] ([id number]) -------------------------------------------------------------------------------- Patient/Caregiver Education Details Patient Name: [name] [name], Zoe P. Date of Service: 04/20/2017 2:15 PM Medical Record Number: [id number] Patient Account Number: [id number] Date of Birth/Gender: [birth year] (83 y.o. Female) Treating RN: Ahmed Prima Primary Care Physician: Raechel Ache, DAVID Other Clinician: Referring Physician: Raechel Ache, DAVID Treating Physician/Extender: Frann Rider in Treatment: 8 Education Assessment Education Provided To: Patient Education Topics Provided Wound/Skin Impairment: Handouts: Other: Please see Primary Care Physician for leg abscess. Methods: Explain/Verbal Responses: State content correctly Electronic Signature(s) Signed: 04/20/2017 4:45:08 PM By: Alric Quan Entered By: Alric Quan on 04/20/2017 15:08:20 Manes, Taeja P. ([id number]) -------------------------------------------------------------------------------- Wound Assessment Details Patient Name: Smiles, Tawni P. Date of Service: 04/20/2017 2:15 PM Medical Record Number: [id number] Patient Account Number: [id number] Date of Birth/Sex: October 13, 1916 (83 y.o. Female) Treating RN: Carolyne Fiscal, Debi Primary Care Ladine Kiper: Raechel Ache, DAVID Other Clinician: Referring Joleena Weisenburger: Raechel Ache, DAVID Treating Srihaan Mastrangelo/Extender: Frann Rider in Treatment: 8 Wound Status Wound Number: 1 Primary Skin Tear Etiology: Wound Location: Left Forearm Wound Healed - Epithelialized Wounding Event: Trauma Status: Date Acquired: 02/03/2017 Comorbid Cataracts, Anemia, Hypertension, Weeks Of Treatment: 8 History: Osteoarthritis, Received Radiation Clustered Wound: No Photos Photo Uploaded By:  Alric Quan on 04/20/2017 16:28:06 Wound  Measurements Length: (cm) 0 % Re Width: (cm) 0 % Re Depth: (cm) 0 Epit Area: (cm) 0 Tun Volume: (cm) 0 Und duction in Area: 100% duction in Volume: 100% helialization: Large (67-100%) neling: No ermining: No Wound Description Full Thickness With Exposed Support Classification: Structures Wound Margin: Flat and Intact Exudate None Present Amount: Foul Odor After Cleansing: No Slough/Fibrino No Wound Bed Granulation Amount: None Present (0%) Exposed Structure Necrotic Amount: None Present (0%) Fascia Exposed: No Fat Layer (Subcutaneous Tissue) Exposed: No Tendon Exposed: No Muscle Exposed: No Joint Exposed: No Bone Exposed: No Periwound Skin Texture Texture Color Ridley, Maegan P. ([id number]) No Abnormalities Noted: No No Abnormalities Noted: No Callus: No Atrophie Blanche: No Crepitus: No Cyanosis: No Excoriation: No Ecchymosis: No Induration: No Erythema: No Rash: No Hemosiderin Staining: No Scarring: No Mottled: No Pallor: No Moisture Rubor: No No Abnormalities Noted: No Dry / Scaly: No Temperature / Pain Maceration: No Temperature: No Abnormality Tenderness on Palpation: Yes Wound Preparation Ulcer Cleansing: Rinsed/Irrigated with Saline Topical Anesthetic Applied: None Electronic Signature(s) Signed: 04/20/2017 4:45:08 PM By: Alric Quan Entered By: Alric Quan on 04/20/2017 14:40:40 Krizek, Shakeya P. ([id number]) -------------------------------------------------------------------------------- Wound Assessment Details Patient Name: Pham, Karisha P. Date of Service: 04/20/2017 2:15 PM Medical Record Number: [id number] Patient Account Number: [id number] Date of Birth/Sex: 10-19-1916 (83 y.o. Female) Treating RN: Carolyne Fiscal, Debi Primary Care Bana Borgmeyer: Raechel Ache, DAVID Other Clinician: Referring Wilson Dusenbery: Raechel Ache, DAVID Treating Rexanne Inocencio/Extender: Frann Rider in Treatment: 8 Wound Status Wound Number: 2 Primary  Abscess Etiology: Wound Location: Right Lower Leg - Lateral Wound Open Wounding Event: Gradually Appeared Status: Date Acquired: 04/17/2017 Comorbid Cataracts, Anemia, Hypertension, Weeks Of Treatment: 0 History: Osteoarthritis, Received Radiation Clustered Wound: No Photos Photo Uploaded By: Alric Quan on 04/20/2017 16:35:16 Wound Measurements Length: (cm) 0.4 Width: (cm) 0.5 Depth: (cm) 0.8 Area: (cm) 0.157 Volume: (cm) 0.126 % Reduction in Area: % Reduction in Volume: Epithelialization: None Tunneling: No Undermining: No Wound Description Classification: Partial Thickness Wound Margin: Distinct, outline attached Exudate Amount: Large Exudate Type: Purulent Exudate Color: yellow, brown, green Foul Odor After Cleansing: No Slough/Fibrino Yes Wound Bed Granulation Amount: Medium (34-66%) Exposed Structure Granulation Quality: Red Fascia Exposed: No Necrotic Amount: Medium (34-66%) Fat Layer (Subcutaneous Tissue) Exposed: No Necrotic Quality: Adherent Slough Tendon Exposed: No Muscle Exposed: No Joint Exposed: No Bone Exposed: No Periwound Skin Texture Arnaud, Kassidie P. ([id number]) Texture Color No Abnormalities Noted: No No Abnormalities Noted: No Erythema: Yes Moisture Erythema Location: Circumferential No Abnormalities Noted: No Maceration: Yes Temperature / Pain Temperature: No Abnormality Tenderness on Palpation: Yes Wound Preparation Ulcer Cleansing: Rinsed/Irrigated with Saline Topical Anesthetic Applied: Other: lidocaine 4%, Treatment Notes Wound #2 (Right, Lateral Lower Leg) 1. Cleansed with: Clean wound with Normal Saline 2. Anesthetic Topical Lidocaine 4% cream to wound bed prior to debridement 5. Secondary Dressing Applied Bordered Foam Dressing Electronic Signature(s) Signed: 04/20/2017 4:32:43 PM By: Alric Quan Entered By: Alric Quan on 04/20/2017 16:32:42 Roache, Tiandra P.  ([id number]) -------------------------------------------------------------------------------- Vitals Details Patient Name: Wetherington, Jaedyn P. Date of Service: 04/20/2017 2:15 PM Medical Record Number: [id number] Patient Account Number: [id number] Date of Birth/Sex: March 30, 1917 (83 y.o. Female) Treating RN: Carolyne Fiscal, Debi Primary Care Saniyah Mondesir: Raechel Ache, DAVID Other Clinician: Referring Karion Cudd: Raechel Ache, DAVID Treating Kariann Wecker/Extender: Frann Rider in Treatment: 8 Vital Signs Time Taken: 14:37 Pulse (bpm): 64 Height (in): 63 Respiratory Rate (breaths/min): 16 Weight (lbs): 149 Blood Pressure (mmHg): 177/63 Body Mass Index (BMI): 26.4 Reference Range: 80 - 120 mg /  dl Notes pts daughter stated that the pts BP has been elevated and is seeing her cardiologist Electronic Signature(s) Signed: 04/20/2017 4:45:08 PM By: Alric Quan Entered By: Alric Quan on 04/20/2017 14:42:15

## 2018-11-16 IMAGING — CT CT MAXILLOFACIAL W/O CM
4 of 10 series · 14 of 47 positions shown, 17 images · non-contrast
Comparison: None.

CLINICAL DATA: Facial trauma, altered level of consciousness

EXAM:
CT HEAD WITHOUT CONTRAST
CT MAXILLOFACIAL WITHOUT CONTRAST
CT CERVICAL SPINE WITHOUT CONTRAST
TECHNIQUE: Multidetector CT imaging of the head, cervical spine, and
maxillofacial structures were performed using the standard protocol
without intravenous contrast. Multiplanar CT image reconstructions
of the cervical spine and maxillofacial structures were also
generated.

[Series 7: c spine soft · axial · 0.31mm/px · z∈[-263,-243]mm · 2 of 74 slices shown]
[im 11/74  brain]
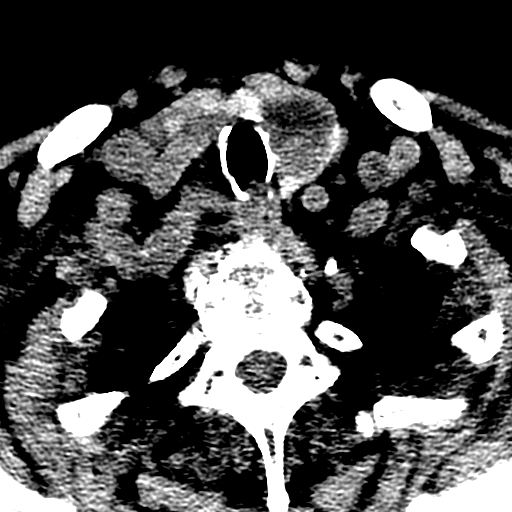
[im 21/74  brain]
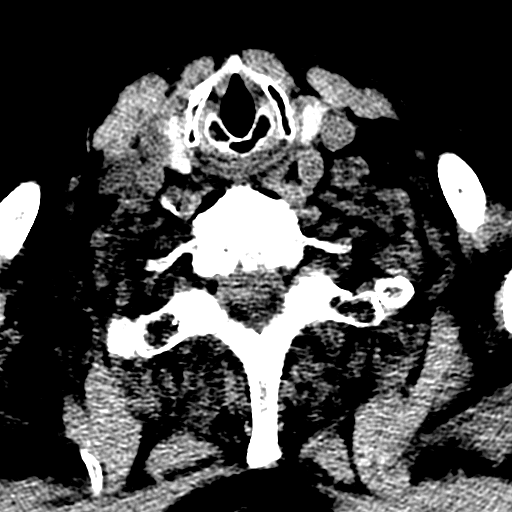

[Series 10: coronal bone · coronal · 0.22mm/px · 1 of 57 slices shown]
[im 29/57  bone]
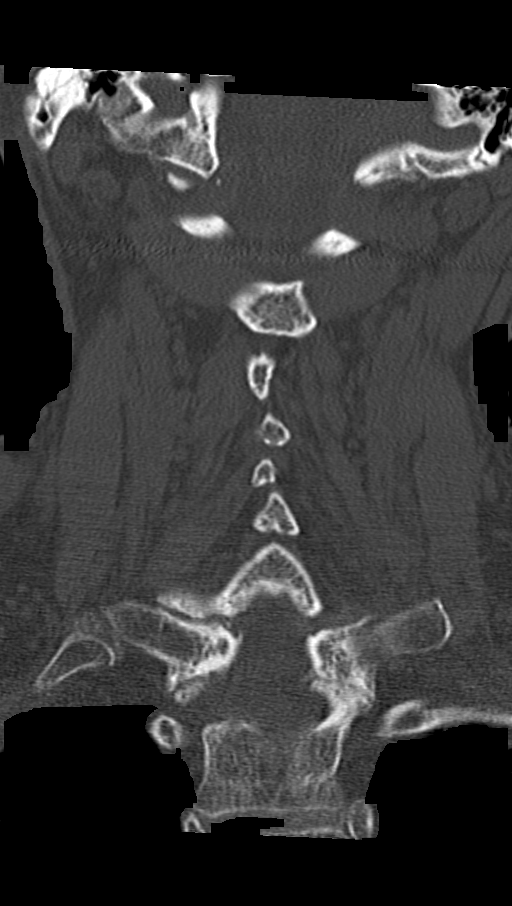

[Series 11: orthogonal axials · axial · 0.26mm/px · z∈[-326,-152]mm · 10 of 118 slices shown, 13 images]
[im 11/118  brain]
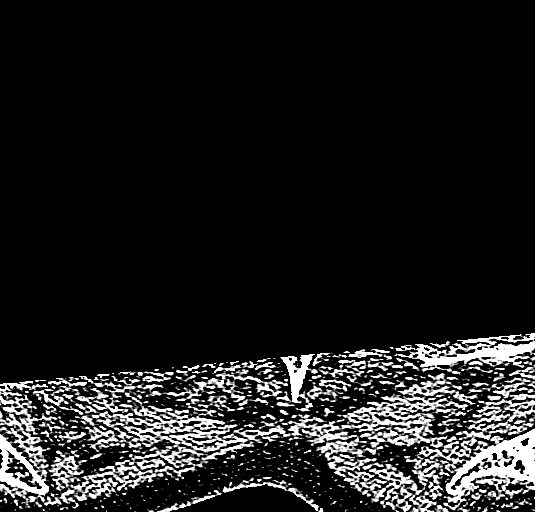
[im 11/118  bone]
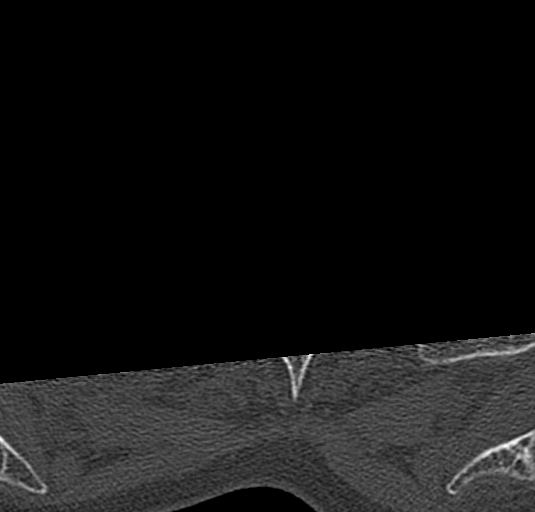
[im 22/118  bone]
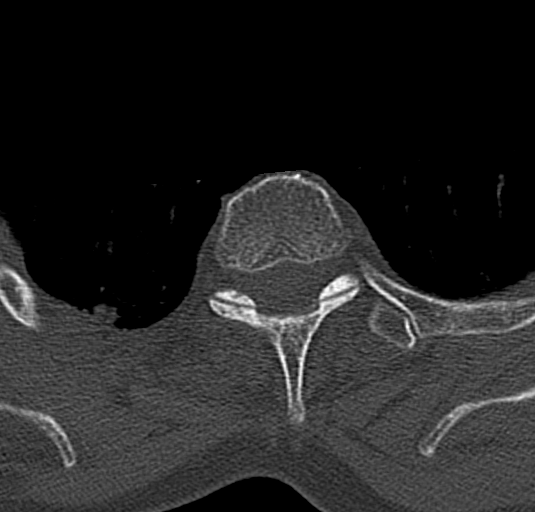
[im 32/118  bone]
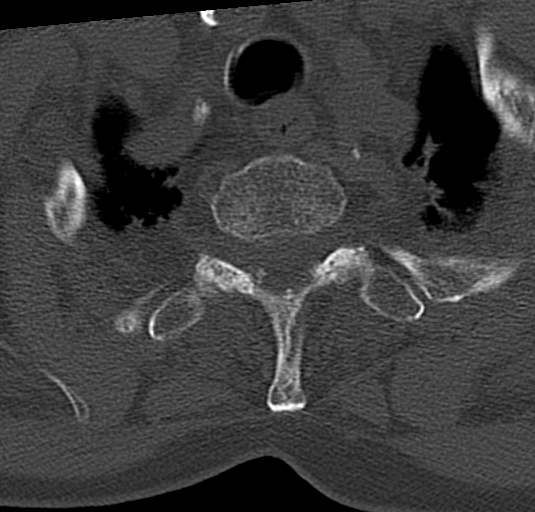
[im 43/118  bone]
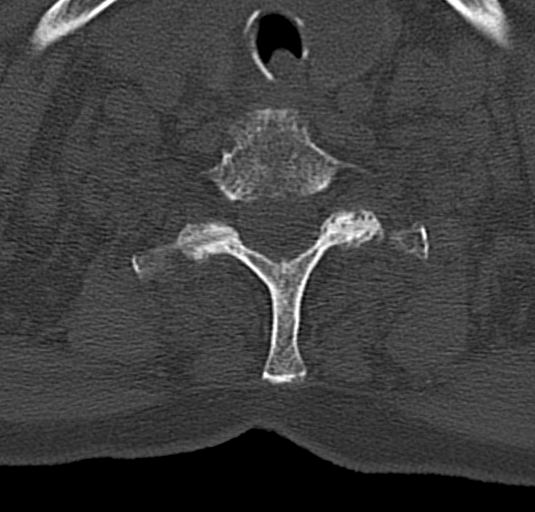
[im 54/118  brain]
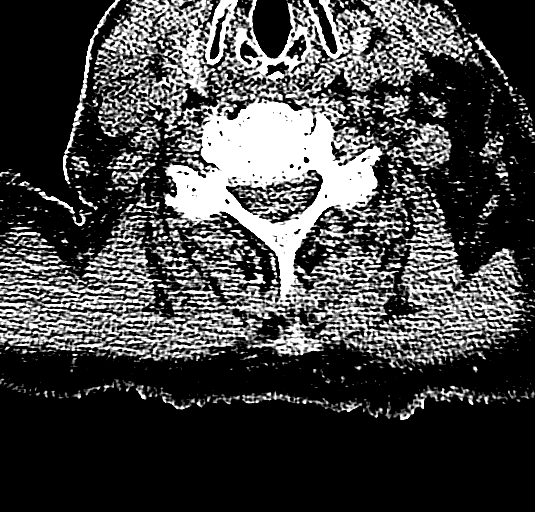
[im 54/118  bone]
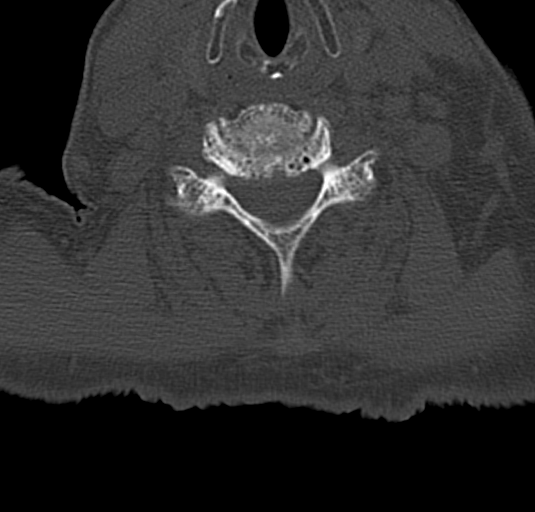
[im 64/118  bone]
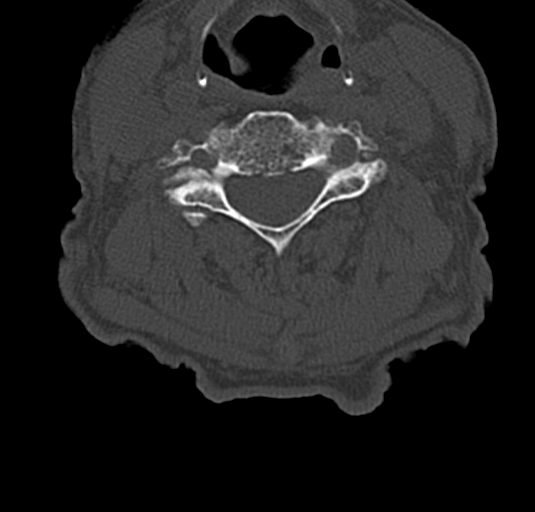
[im 75/118  bone]
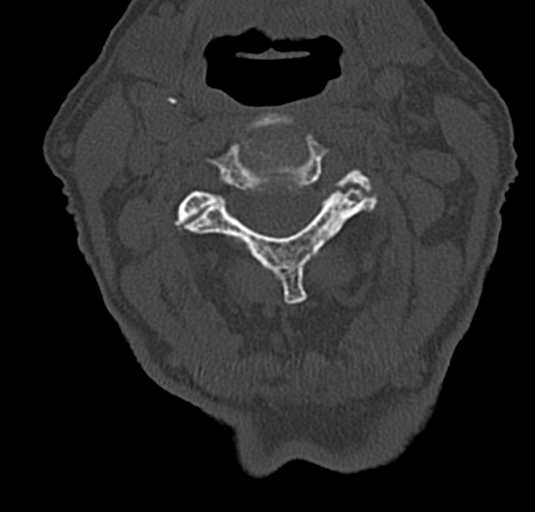
[im 86/118  bone]
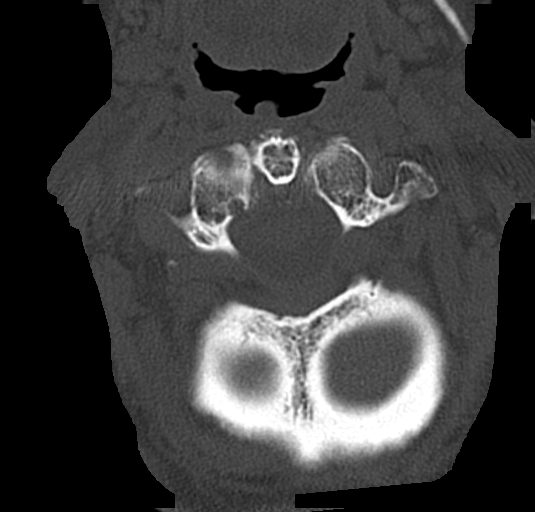
[im 96/118  brain]
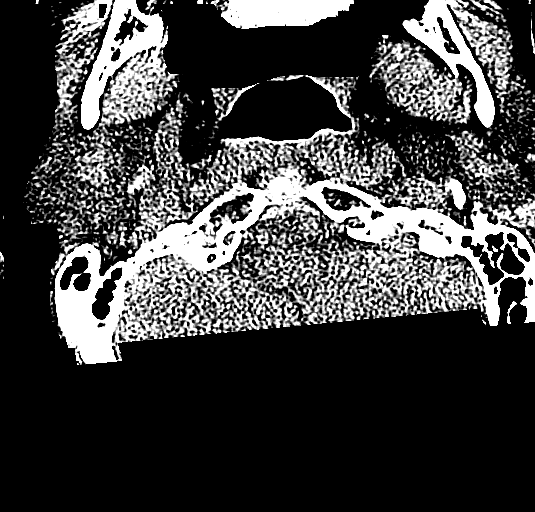
[im 96/118  bone]
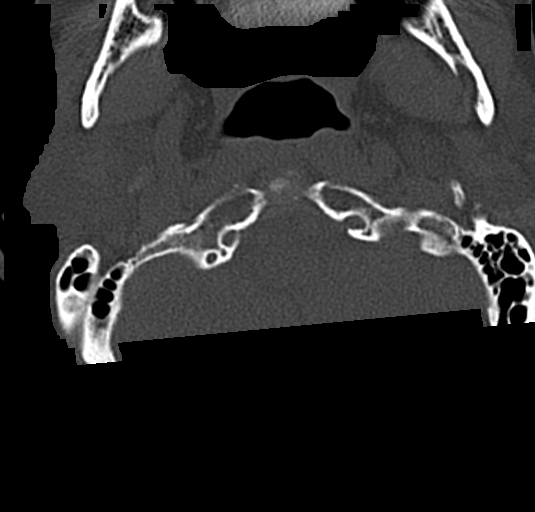
[im 107/118  bone]
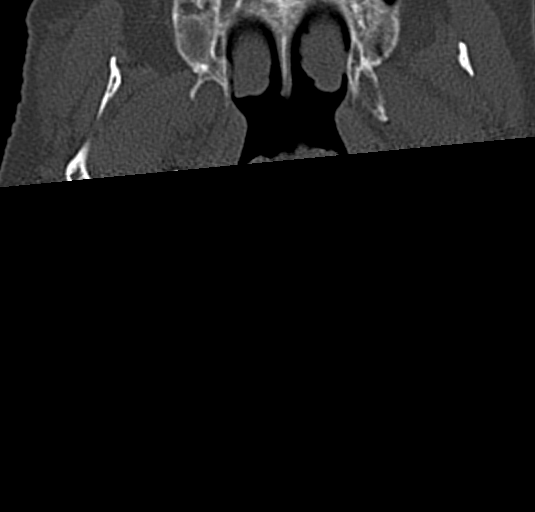

[Series 18: sagittal soft · sagittal · 0.32mm/px · 1 of 90 slices shown]
[im 45/90  bone]
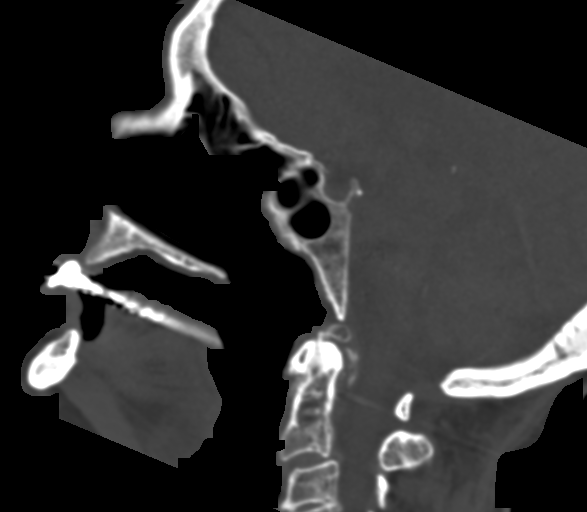

[14 of 47 positions shown; findings below may reference images not displayed]

FINDINGS: CT HEAD FINDINGS

Brain: No evidence of acute infarction, hemorrhage, hydrocephalus,
extra-axial collection or mass lesion/mass effect.

Subcortical white matter and periventricular small vessel ischemic
changes.

Vascular: Intracranial atherosclerosis.

Skull: Normal. Negative for fracture or focal lesion.

Other: Multiple calcified subcutaneous lesions, benign.

CT MAXILLOFACIAL FINDINGS

Osseous: No evidence of maxillofacial fracture.

The mandible is intact. The bilateral mandibular condyles are
well-seated in the TMJs.

Orbits: Bilateral orbits, including the globes and retroconal soft
tissues, the within normal limits.

Sinuses: Mild mucosal thickening involving the right maxillary
sinus. Visualized paranasal sinuses and mastoid air cells are
otherwise clear.

Soft tissues: Suspected mild soft tissue swelling/hematoma overlying
the left parietal bone (series 12/image 4) and left lateral zygoma
(series 12/ image 26).

CT CERVICAL SPINE FINDINGS

Alignment: Normal cervical lordosis.

Skull base and vertebrae: No acute fracture. No primary bone lesion
or focal pathologic process.

Soft tissues and spinal canal: No prevertebral fluid or swelling. No
visible canal hematoma.

Disc levels:  Mild multilevel degenerative changes.

Upper chest: Visualized lung apices are notable for biapical
pleural-parenchymal scarring.

Other: Bilateral thyroid nodules, including a 2.8 cm left thyroid
nodule, of questionable clinical significance given the patient's
age.
IMPRESSION: Mild soft tissue swelling/ hematoma overlying the left parietal bone
and left lateral zygoma.

No evidence of maxillofacial fracture.

No evidence of acute intracranial abnormality. Small vessel ischemic
changes.

No evidence of traumatic injury to the cervical spine. Mild
multilevel degenerative changes.

## 2019-02-07 ENCOUNTER — Other Ambulatory Visit: Payer: Self-pay

## 2019-02-07 ENCOUNTER — Encounter: Payer: Self-pay | Admitting: *Deleted

## 2019-02-11 ENCOUNTER — Other Ambulatory Visit
Admission: RE | Admit: 2019-02-11 | Discharge: 2019-02-11 | Disposition: A | Discharge status: Home / Self Care | Payer: Medicare Other | Source: Ambulatory Visit | Attending: Otolaryngology | Admitting: Otolaryngology

## 2019-02-11 ENCOUNTER — Inpatient Hospital Stay
Admission: RE | Admit: 2019-02-11 | Discharge: 2019-02-11 | Disposition: A | Discharge status: Home / Self Care | Payer: Medicare Other | Source: Ambulatory Visit

## 2019-02-11 DIAGNOSIS — Z01812 Encounter for preprocedural laboratory examination: Secondary | ICD-10-CM | POA: Diagnosis present

## 2019-02-11 DIAGNOSIS — Z20828 Contact with and (suspected) exposure to other viral communicable diseases: Secondary | ICD-10-CM | POA: Diagnosis not present

## 2019-02-11 HISTORY — DX: Unspecified atrial fibrillation: I48.91

## 2019-02-11 NOTE — Pre-Procedure Instructions (Signed)
Echo complete8/17/2020 [place] Component Name Value Ref Range  LV Ejection Fraction (%) 50   Aortic Valve Stenosis Grade severe   Aortic Valve Regurgitation Grade none   Aortic Valve Stenosis Mean Gradient (mmHg) 42.0 mmHg  Aortic Valve Max Velocity (m/s) 4.2 m/sec  Mitral Valve Stenosis Grade none   Mitral Valve Regurgitation Grade moderate   Tricuspid Valve Regurgitation Grade severe   Tricuspid Valve Regurgitation Max Velocity (m/s) 3.2 m/sec  Right Ventricle Systolic Pressure (mmHg) 0000000 mmHg  LV End Diastolic Diameter (cm) 4.2 cm  LV End Systolic Diameter (cm) 3 cm  LV Septum Wall Thickness (cm) 1.5 cm  LV Posterior Wall Thickness (cm) 1.2 cm  Left Atrium Diameter (cm) 4.7 cm  Result Narrative                [name] [name], [name] [name]      [facility]              [phone] MEDICAL [address] [place], [place] [id number]      Acct #: [id number]                                Date: 12/07/2018 01: 27 PM                                Adult  Female  Age: 85 yrs      ECHOCARDIOGRAM REPORT                                Outpatient                                [facility]                                MD1: [name] [name]    STUDY:CHEST WALL        TAPE:    ECHO:Yes  DOPPLER:Yes    FILE:          BP: 120/60 mmHg    COLOR:Yes  CONTRAST:No   MACHINE:Philips  RV BIOPSY:No     3D:No SOUND QLTY:Moderate      Height: 62 in   MEDIUM:None                       Weight: 154 lb                                BSA: 1.7 m2 _________________________________________________________________________________________        HISTORY: DOE          REASON: Assess, LV function       INDICATION: Moderate aortic valve stenosis [I35.0 (ICD-10-CM)] _________________________________________________________________________________________ ECHOCARDIOGRAPHIC MEASUREMENTS 2D DIMENSIONS AORTA         Values  Normal Range  MAIN PA     Values  Normal Range        Annulus: 1.7 cm    [2.1-2.5]     PA Main: nm*    [1.5-2.1]  Aorta Sin: 3.0 cm    [2.7-3.3]  RIGHT VENTRICLE      ST Junction: nm*     [2.3-2.9]     RV Base: nm*    [<4.2]       Asc.Aorta: nm*     [2.3-3.1]     RV Mid: 3.4 cm  [<3.5] LEFT VENTRICLE                   RV Length: nm*    [<8.6]         LVIDd: 4.2 cm    [3.9-5.3]  INFERIOR VENA CAVA         LVIDs: 3.0 cm            Max. IVC: nm*    [<=2.1]           FS: 28.3 %    [>25]      Min. IVC: nm*          SWT: 1.5 cm    [0.5-0.9]  ------------------          PWT: 1.2 cm    [0.5-0.9]  nm* - not measured LEFT ATRIUM        LA Diam: 4.7 cm    [2.7-3.8]      LA A4C Area: nm*     [<20]       LA Volume: nm*     [22-52] _________________________________________________________________________________________ ECHOCARDIOGRAPHIC DESCRIPTIONS AORTIC ROOT          Size: Normal       Dissection: INDETERM FOR DISSECTION AORTIC VALVE        Leaflets: Tricuspid          Morphology: SEVERELY THICKENED        Mobility: PARTIALLY MOBILE LEFT VENTRICLE          Size: Normal            Anterior: Normal      Contraction: Normal             Lateral: Normal       Closest EF: 50% (Estimated)         Septal: Normal       LV Masses: No Masses            Apical: Normal          LVH: MODERATE LVH          Inferior: Normal                           Posterior: Normal      Dias.FxClass: N/A MITRAL VALVE        Leaflets: Normal            Mobility: Fully mobile       Morphology: ANNULAR CALC LEFT ATRIUM          Size: SEVERELY ENLARGED      LA Masses: No masses       IA Septum: Normal IAS MAIN PA          Size: Normal PULMONIC VALVE       Morphology: Normal            Mobility: Fully mobile RIGHT VENTRICLE       RV Masses: No Masses             Size: Normal       Free Wall: Normal           Contraction: Normal  TRICUSPID VALVE        Leaflets: Normal            Mobility: Fully mobile       Morphology: Normal RIGHT ATRIUM          Size: SEVERELY ENLARGED       RA Other: None        RA Mass: No masses PERICARDIUM         Fluid: No effusion INFERIOR VENACAVA          Size: Normal Normal respiratory collapse _________________________________________________________________________________________  DOPPLER ECHO and OTHER SPECIAL PROCEDURES         Aortic: No AR           SEVERE AS             419.0 cm/sec peak vel   70.3 mmHg peak grad             42.0 mmHg mean grad         Mitral: MODERATE MR        No MS             MV Inflow E Vel = 151.0 cm/sec   MV Annulus E'Vel = 9.6 cm/sec             E/E'Ratio = 15.7       Tricuspid: SEVERE TR         No TS             322.0 cm/sec peak TR vel  61.6 mmHg peak RV pressure       Pulmonary: MILD PR          No PS             81.4 cm/sec peak vel    2.7 mmHg peak grad _________________________________________________________________________________________ INTERPRETATION NORMAL LEFT VENTRICULAR SYSTOLIC FUNCTION  WITH  MODERATE LVH NORMAL RIGHT VENTRICULAR SYSTOLIC FUNCTION SEVERE VALVULAR REGURGITATION (See above) SEVERE VALVULAR STENOSIS (See above) AVA(VTI)=.55cm^2 MODERATE MR SEVERE TR MODERATE PHTN MILD PR EF 50-55% _________________________________________________________________________________________ Electronically signed by   MD [name] [name] on 12/10/2018 11: 23 AM      Performed By: [name] [name], RDCS   Ordering Physician: [name] [name] _________________________________________________________________________________________  Other Result Information  Interface, Text Results In - 12/10/2018 11:24 AM EDT                            [facility]                   [name] [name] [facility]                           [phone]           [place], [place], [place] [id number]           Acct #: [id number]                                                              Date: 12/07/2018 01: 26 PM  Adult   Female   Age: 81 yrs           ECHOCARDIOGRAM REPORT                                                              Outpatient                                                              [facility]                                                              MD1: [name] [name]      STUDY:CHEST WALL               TAPE:       ECHO:Yes    DOPPLER:Yes       FILE:                    BP: 120/60 mmHg      COLOR:Yes   CONTRAST:No     MACHINE:Philips  RV BIOPSY:No          3D:No  SOUND QLTY:Moderate            Height: 62 in     MEDIUM:None                                              Weight: 154 lb                                                              BSA: 1.7 m2 _________________________________________________________________________________________               HISTORY: DOE                REASON: Assess, LV function            INDICATION: Moderate aortic valve stenosis [I35.0  (ICD-10-CM)] _________________________________________________________________________________________ ECHOCARDIOGRAPHIC MEASUREMENTS 2D DIMENSIONS AORTA                  Values   Normal Range   MAIN PA         Values    Normal Range               Annulus: 1.7 cm       [2.1-2.5]         PA Main: nm*       [1.5-2.1]             Aorta Sin: 3.0 cm       [2.7-3.3]  RIGHT VENTRICLE           ST Junction: nm*          [2.3-2.9]         RV Base: nm*       [<4.2]             Asc.Aorta: nm*          [2.3-3.1]          RV Mid: 3.4 cm    [<3.5] LEFT VENTRICLE                                      RV Length: nm*       [<8.6]                 LVIDd: 4.2 cm       [3.9-5.3]    INFERIOR VENA CAVA                 LVIDs: 3.0 cm                        Max. IVC: nm*       [<=2.1]                    FS: 28.3 %       [>25]            Min. IVC: nm*                   SWT: 1.5 cm       [0.5-0.9]    ------------------                   PWT: 1.2 cm       [0.5-0.9]    nm* - not measured LEFT ATRIUM               LA Diam: 4.7 cm       [2.7-3.8]           LA A4C Area: nm*          [<20]             LA Volume: nm*          [22-52] _________________________________________________________________________________________ ECHOCARDIOGRAPHIC DESCRIPTIONS AORTIC ROOT                  Size: Normal            Dissection: INDETERM FOR DISSECTION AORTIC VALVE              Leaflets: Tricuspid                   Morphology: SEVERELY THICKENED              Mobility: PARTIALLY MOBILE LEFT VENTRICLE                  Size: Normal                        Anterior: Normal           Contraction: Normal                         Lateral: Normal            Closest EF: 50% (Estimated)  Septal: Normal             LV Masses: No Masses                       Apical: Normal                   LVH: MODERATE LVH                  Inferior: Normal                                                     Posterior: Normal           Dias.FxClass: N/A MITRAL VALVE              Leaflets: Normal                        Mobility: Fully mobile            Morphology: ANNULAR CALC LEFT ATRIUM                  Size: SEVERELY ENLARGED            LA Masses: No masses             IA Septum: Normal IAS MAIN PA                  Size: Normal PULMONIC VALVE            Morphology: Normal                        Mobility: Fully mobile RIGHT VENTRICLE             RV Masses: No Masses                         Size: Normal             Free Wall: Normal                     Contraction: Normal TRICUSPID VALVE              Leaflets: Normal                        Mobility: Fully mobile            Morphology: Normal RIGHT ATRIUM                  Size: SEVERELY ENLARGED             RA Other: None               RA Mass: No masses PERICARDIUM                 Fluid: No effusion INFERIOR VENACAVA                  Size: Normal Normal respiratory collapse _________________________________________________________________________________________  DOPPLER ECHO and OTHER SPECIAL PROCEDURES                Aortic: No AR  SEVERE AS                        419.0 cm/sec peak vel      70.3 mmHg peak grad                        42.0 mmHg mean grad                Mitral: MODERATE MR                No MS                        MV Inflow E Vel = 151.0 cm/sec      MV Annulus E'Vel = 9.6 cm/sec                        E/E'Ratio = 15.7             Tricuspid: SEVERE TR                  No TS                        322.0 cm/sec peak TR vel   61.6 mmHg peak RV pressure             Pulmonary: MILD PR                    No PS                        81.4 cm/sec peak vel       2.7 mmHg peak grad _________________________________________________________________________________________ INTERPRETATION NORMAL LEFT VENTRICULAR SYSTOLIC FUNCTION   WITH MODERATE LVH NORMAL RIGHT VENTRICULAR SYSTOLIC FUNCTION SEVERE VALVULAR REGURGITATION (See  above) SEVERE VALVULAR STENOSIS (See above) AVA(VTI)=.55cm^2 MODERATE MR SEVERE TR MODERATE PHTN MILD PR EF 50-55% _________________________________________________________________________________________ Electronically signed by      MD [name] [name] on 12/10/2018 11: 23 AM          Performed By: [name] [name], RDCS    Ordering Physician: [name] [name] _________________________________________________________________________________________  Status Results Details   Encounter Summary

## 2019-02-11 NOTE — Pre-Procedure Instructions (Signed)
Progress Notes - documented in this encounter [name], MD - 12/26/2018 2:15 PM EDT Formatting of this note might be different from the original. Established Patient Visit   Chief Complaint: Chief Complaint  Patient presents with  . Peripheral Vascular Disease  . Hypertension  . Coronary Artery Disease  Date of Service: 12/26/2018 Date of Birth: 1916/11/17 PCP: Mancel Bale, MD  History of Present Illness: Ms. [name] [name] is a 85 y.o.female patient  Shortness Of Breath/Dyspnea The patient is having difficulty with chronic dyspnea and or shortness of breath worsening with increased severity over the last 4 years. This occurs with mild exertion and limits ADLs and is also associated with walking and relieved by rest with a duration that is intermittent (1-10 minutes). Other related symptoms include fatigue and lower extremity edema. The patient has risk factors of this occurrence including age, postmenopausal female, HTN, Hyperlipidemia and sedentary life style. The source of these symptoms and investigation should include hypertension, valve disease and decrease exercise tolerance  Peripheral vascular disease The patient has chronic peripheral vascular disease including carotid artery disease without significant symptoms and/or complications on Blood pressure treatment. Current risk factors include Hypertension , Peripheral vascular disease and Blood thinner use . We have discussed risk factor modification including treatment of high blood pressure and high cholesterol with medication management for further long-term risk reduction. The patient understands and agrees.  Heart Failure with Preserved Ejection Fraction (HFpEF) The patient has a chronic problem of heart failure with preserved ejection fraction (HFpEF >50%) diagnosed with echocardiogram and clinical signs and symptoms. Current etiology and or contributing factors include valve disease and hypertensive heart disease. The  patient has symptoms including dyspnea, exertional chest pressure/discomfort and fatigue stable at this time. Treatment has included ca++blocker and diuretic. Patient appears euvolemic.  Left Ventricular Hypertrophy The patient has a diagnosis of left ventricular hypertrophy years ago. The diagnosis is supported by electrocardiographic and echocardiographic findings. Current causes have included valvular heart disease. The patient has risk factors that include age and symptoms that include fatigue and lower extremity edema stable. The patient understands that prevention of significant left ventricular hypertrophy can also reduce the possibility of future congestive heart failure. Therefore, the patient is receiving treatment with ca++ blocker and diuretic  Echocardiogram The patient has had an echocardiogram showing Normal LV LVEF > 55% Severe AS Mild MR without pulmonary hypertension but moderate LVh Edema The patient has chronic Trace lower extremity edema multifactorial in nature including congestive heart failure, venous insufficiency and medication side effects over the last 2 years currently on Furosemide (Lasix) stable. The patient has been diligent in other treatment options including compression hose, a low-sodium diet, and leg elevation    Results for orders placed or performed in visit on 62/26/33  Basic Metabolic Panel (BMP)  Result Value Ref Range  Glucose 101 70 - 110 mg/dL  Sodium 134 (L) 136 - 145 mmol/L  Potassium 4.5 3.6 - 5.1 mmol/L  Chloride 100 97 - 109 mmol/L  Carbon Dioxide (CO2) 27.2 22.0 - 32.0 mmol/L  Calcium 9.8 8.7 - 10.3 mg/dL  Urea Nitrogen (BUN) 13 7 - 25 mg/dL  Creatinine 1.0 0.6 - 1.1 mg/dL  Glomerular Filtration Rate (eGFR), MDRD Estimate 51 (L) >60 mL/min/1.73sq m  BUN/Crea Ratio 13.0 6.0 - 20.0  Anion Gap w/K 11.3 6.0 - 16.0  CBC w/auto Differential (3 Part)  Result Value Ref Range  WBC (White Blood Cell Count) 8.7 4.1 - 10.2 10^3/uL  RBC (Red Blood Cell  Count) 3.46 (L) 4.04 - 5.48 10^6/uL  Hemoglobin 11.3 (L) 12.0 - 15.0 gm/dL  Hematocrit 34.2 (L) 35.0 - 47.0 %  MCV (Mean Corpuscular Volume) 98.8 80.0 - 100.0 fl  MCH (Mean Corpuscular Hemoglobin) 32.7 (H) 27.0 - 31.2 pg  MCHC (Mean Corpuscular Hemoglobin Concentration) 33.0 32.0 - 36.0 gm/dL  Platelet Count 224 150 - 450 10^3/uL  RDW-CV (Red Cell Distribution Width) 14.0 11.6 - 14.8 %  MPV (Mean Platelet Volume) 9.1 (L) 9.4 - 12.4 fl  Neutrophils 6.80 1.50 - 7.80 10^3/uL  Lymphocytes 1.20 1.00 - 3.60 10^3/uL  Mixed Count 0.70 0.10 - 0.90 10^3/uL  Neutrophil % 77.3 (H) 32.0 - 70.0 %  Lymphocyte % 14.1 10.0 - 50.0 %  Mixed % 8.6 3.0 - 14.4 %   Past Medical and Surgical History  Past Medical History Past Medical History:  Diagnosis Date  . Anemia 01/03/2014  . Bilateral carpal tunnel syndrome 2002  Mild.  . Breast cancer (CMS-HCC) 12/25/1987  S/P lumpectomy 12/1987, XRT  . Closed fracture of right distal radius 08/25/14  s/p fall in yard.  . Degenerative disk disease  Mild.  . Essential hypertension, benign 01/03/2014  . Gout, joint  . Hypercalcemia 01/03/2014  . Hyperlipidemia 01/03/2014  . MRSA (methicillin resistant staph aureus) culture positive  . Osteoarthritis 01/03/2014  . Osteoporosis  . Sensorineural hearing loss of both ears  Dr. Pryor Ochoa  . Valvular heart disease 01/07/2015  Moderate MR/TR on 01/2009 echocardiogram  . Wound, open, arm, forearm, left, subsequent encounter 02/28/2017  Large skin tear of left forearm from fall 02/02/2017, seen at [facility] 02/17/2017   Past Surgical History She has a past surgical history that includes Cataract extraction (Bilateral); Replacement total knee (Right, 01/1996); and Mastectomy partial / lumpectomy (Left, 12/1987).   Medications and Allergies  Current Medications  Current Outpatient Medications on File Prior to Visit  Medication Sig Dispense Refill  . acetaminophen (TYLENOL) 500 MG tablet Take by mouth nightly  .  AMOXICILLIN ORAL Take 4 tablets by mouth as needed (when going to the dentist)  . calcium carbonate-vitamin D3 (CALTRATE 600+D) 600 mg(1,585m) -400 unit Chew chewable tablet Take by mouth once daily.  .Marland Kitchendiltiazem (CARDIZEM CD) 120 MG XR capsule TAKE 1 CAPSULE BY MOUTH TWICE DAILY 180 capsule 1  . ELIQUIS 5 mg tablet TAKE 1 TABLET BY MOUTH TWICE DAILY 60 tablet 11  . ferrous sulfate 325 (65 FE) MG tablet Take 325 mg by mouth 2 (two) times daily with meals And a glass of orange juice  . FUROsemide (LASIX) 40 MG tablet TAKE 1 TABLET BY MOUTH ONCE EVERY MORNING AS DIRECTED FOR FLUID. 30 tablet 1  . glucosamine & chondroit sul.Na (GLUCOSAMINE-CHONDROITIN 3X) 750-600 mg Tab Take 1 tablet by mouth 2 (two) times daily. OSTEO BIFLEX  . hydrALAZINE (APRESOLINE) 50 MG tablet Take 1 tablet (50 mg total) by mouth 2 (two) times daily For blood pressure 180 tablet 3  . meclizine (ANTIVERT) 25 mg tablet Take 1 tablet (25 mg total) by mouth every 6 (six) hours as needed for Dizziness 40 tablet 1  . multivit-min-FA-lycopen-lutein 0.4-300-250 mg-mcg-mcg Tab Take by mouth once daily.  .Marland Kitchentriamcinolone (NASACORT AQ) 55 mcg nasal spray Place 1-2 sprays into both nostrils nightly as needed  . VIT A/VIT C/VIT E/ZINC/COPPER (ICAPS AREDS ORAL) Take 1 capsule by mouth 2 (two) times daily.   No current facility-administered medications on file prior to visit.   Allergies: Patient has no known allergies.  Social and Family  History  Social History reports that she has never smoked. She has never used smokeless tobacco. She reports that she does not drink alcohol or use drugs.  Family History Family History  Problem Relation Age of Onset  . High blood pressure (Hypertension) Mother  . Melanoma Mother  . Stroke Mother  . Coronary Artery Disease (Blocked arteries around heart) Father  . Prostate cancer Brother   Review of Systems   Review of Systems  Positive for sob edema Negative for weight gain weight loss,  weakness, , hearing loss, cough, congestion, PND, orthopnea, heartburn, nausea, diaphoresis, vomiting, diarrhea, bloody stool, stomach pain, extremity pain, leg weakness, leg cramping, leg blood clots, headache, blackouts, nosebleed, trouble swallowing, mouth pain, urinary frequency, urination at night, muscle weakness, skin lesions, skin rashes, tingling ,ulcers, numbness, anxiety, and/or depression Physical Examination   Vitals:BP 122/70 (BP Location: Left upper arm, Patient Position: Sitting, BP Cuff Size: Adult)  Pulse 73  Resp 15  Ht 160 cm (_0 )  Wt 71.8 kg (158 lb 3.2 oz)  SpO2 97%  BMI 28.02 kg/m  Ht:160 cm (_1 ) Wt:71.8 kg (158 lb 3.2 oz) BJS:EGBT surface area is 1.79 meters squared. Body mass index is 28.02 kg/m. Appearance: well appearing  HEENT: Pupils equally reactive to light and accomodation, no xanthalasma  Neck: Supple, no apparent thyromegaly, masses, or lymphadenopathy  Lungs: normal respiratory effort; no crackles, no rhonchi, no wheezes Heart: Regular rate and rhythm. Normal S1 soft S2 No gallops, 4+aortic murmur, no rub, PMI is normal size and placement. carotid upstroke normal with bruit. Jugular venous pressure is normal Abdomen: soft, nontender, not distended with normal bowel sounds. No apparent hepatosplenomegally. Abdominal aorta is normal size without bruit Extremities: trace edema, no ulcers, no clubbing, no cyanosis Peripheral Pulses: 2+ in upper extremities, 2+ femoral pulses bilaterally, 2+lower extremity  Musculoskeletal; Normal muscle tone without kyphosis Neurological: Oriented and Alert, Cranial nerves intact  Assessment   85 y.o. female with  Encounter Diagnoses  Name Primary?  . Bilateral carotid artery stenosis  . Chronic diastolic CHF (congestive heart failure), NYHA class 2 (CMS-HCC)  . Essential hypertension, benign  . Moderate aortic valve stenosis  . Severe aortic valve stenosis Yes   Plan  -Continue current medical regimen of  anticoagulation medication and other risk factor management for peripheral vascular disease and stroke risk reduction without change today which appears to be stable. The patient understands future goals of treatment. -There has been a lengthy discussion of the etiology and treatment of diastolic dysfunction heart failure (HFpEF). The patient will use a medical regimen listed to reduce exacerbation and hospitalization. We will continue to emphasize low sodium diet and direct risk factor control.  -There has been a review of medication management for hypertension control and of the current medical regimen used. The patient understands all the risks and benefits of hypertension control with these medications to reduce possible future cardiovascular complications and risk of disease. There will be no changes in medication regimen necessary today. -We have discussed the signs and symptoms of progression of aortic valve stenosis severity including shortness of breath, chest pain, fatigue, dizziness, syncope, and lower extremity swelling. The patient understands the potential changes and possible pathophysiology of aortic valve stenosis. The patient will watch for these issues between each follow up appointment. -Furosemide for lower extremity edema with continued discussion of other home treatment options including the DASH diet and or compression hose -apixaban for anticoagulation and further risk reduction of stroke with atrial fibrillation/flutter. Patient  understands all risks and benefits of this medication management including the possibility of side effects including bleeding, bruising, stroke, and interactions with other medication management. The patient understands that we are following current guideline therapy for risk reduction. We will follow closely for any side effects and/or issues listed above.  No orders of the defined types were placed in this encounter.  Return in about 6 months (around  06/25/2019).  [name], MD    Electronically signed by [name], MD at 12/26/2018 2:46 PM EDT

## 2019-02-11 NOTE — Pre-Procedure Instructions (Signed)
ECG 12 Lead6/30/2019 [facility] Component Name Value Ref Range  EKG Systolic BP  mmHg  EKG Diastolic BP  mmHg  EKG Ventricular Rate 112 BPM  EKG Atrial Rate 129 BPM  EKG P-R Interval  ms  EKG QRS Duration 108 ms  EKG Q-T Interval 278 ms  EKG QTC Calculation 379 ms  EKG Calculated P Axis  degrees  EKG Calculated R Axis -58 degrees  EKG Calculated T Axis 98 degrees  QTC Fredericia 342 ms  Result Narrative  ATRIAL FIBRILLATION WITH RAPID VENTRICULAR RESPONSE LEFT ANTERIOR FASCICULAR BLOCK MODERATE VOLTAGE CRITERIA FOR LVH, MAY BE NORMAL VARIANT NONSPECIFIC ST-T WAVE ABNORMALITIES WHEN COMPARED WITH ECG OF 04-Sep-2017 08:51, RATE HAS INCREASED Confirmed by [facility] Records ([id number]) on 10/22/2017 4:02:02 PM  Other Result Information  Interface, Rad Results In - 10/22/2017  4:02 PM EDT ATRIAL FIBRILLATION WITH RAPID VENTRICULAR RESPONSE LEFT ANTERIOR FASCICULAR BLOCK MODERATE VOLTAGE CRITERIA FOR LVH, MAY BE NORMAL VARIANT NONSPECIFIC ST-T WAVE ABNORMALITIES WHEN COMPARED WITH ECG OF 04-Sep-2017 08:51, RATE HAS INCREASED Confirmed by [facility] ([id number]) on 10/22/2017 4:02:02 PM  Status Results Details

## 2019-02-12 ENCOUNTER — Encounter
Admission: RE | Admit: 2019-02-12 | Discharge: 2019-02-12 | Disposition: A | Discharge status: Home / Self Care | Payer: Medicare Other | Source: Ambulatory Visit | Attending: Otolaryngology | Admitting: Otolaryngology

## 2019-02-12 ENCOUNTER — Other Ambulatory Visit: Payer: Self-pay

## 2019-02-12 LAB — SARS CORONAVIRUS 2 (TAT 6-24 HRS): SARS Coronavirus 2: NEGATIVE

## 2019-02-12 NOTE — Pre-Procedure Instructions (Signed)
Daughter concerned as to when to restart Eliquis. Called Dr Alveria Apley office regarding when to restart Eliquis.  Restart Eliquis when OK with Dr Kathyrn Sheriff, "may wait up to 3 days post op before resuming medication."  Daughter called regarding the above mentoned.

## 2019-02-12 NOTE — Patient Instructions (Signed)
Your procedure is scheduled on: 02/13/2019 Wed Report to Same Day Surgery 2nd floor medical mall [facility] Entrance-take elevator on left to 2nd floor.  Check in with surgery information desk.) To find out your arrival time please call [phone] between 1PM - 3PM on 02/12/2019 Tues  Remember: Instructions that are not followed completely may result in serious medical risk, up to and including death, or upon the discretion of your surgeon and anesthesiologist your surgery may need to be rescheduled.    _x___ 1. Do not eat food after midnight the night before your procedure. You may drink clear liquids up to 2 hours before you are scheduled to arrive at the hospital for your procedure.  Do not drink clear liquids within 2 hours of your scheduled arrival to the hospital.  Clear liquids include  --Water or Apple juice without pulp  --Clear carbohydrate beverage such as ClearFast or Gatorade  --Black Coffee or Clear Tea (No milk, no creamers, do not add anything to                  the coffee or Tea Type 1 and type 2 diabetics should only drink water.   ____Ensure clear carbohydrate drink on the way to the hospital for bariatric patients  ____Ensure clear carbohydrate drink 3 hours before surgery.   No gum chewing or hard candies.     __x__ 2. No Alcohol for 24 hours before or after surgery.   __x__3. No Smoking or e-cigarettes for 24 prior to surgery.  Do not use any chewable tobacco products for at least 6 hour prior to surgery   ____  4. Bring all medications with you on the day of surgery if instructed.    __x__ 5. Notify your doctor if there is any change in your medical condition     (cold, fever, infections).    x___6. On the morning of surgery brush your teeth with toothpaste and water.  You may rinse your mouth with mouth wash if you wish.  Do not swallow any toothpaste or mouthwash.   Do not wear jewelry, make-up, hairpins, clips or nail polish.  Do not wear lotions,  powders, or perfumes. You may wear deodorant.  Do not shave 48 hours prior to surgery. Men may shave face and neck.  Do not bring valuables to the hospital.    [facility] is not responsible for any belongings or valuables.               Contacts, dentures or bridgework may not be worn into surgery.  Leave your suitcase in the car. After surgery it may be brought to your room.  For patients admitted to the hospital, discharge time is determined by your                       treatment team.  _  Patients discharged the day of surgery will not be allowed to drive home.  You will need someone to drive you home and stay with you the night of your procedure.    Please read over the following fact sheets that you were given:   [facility] Preparing for Surgery and or MRSA Information   _x___ Take anti-hypertensive listed below, cardiac, seizure, asthma,     anti-reflux and psychiatric medicines. These include:  1. diltiazem (DILACOR XR) 120 MG 24 hr capsule  2.hydrALAZINE (APRESOLINE) 50 MG tablet  3.  4.  5.  6.  ____Fleets enema or  Magnesium Citrate as directed.   _x___ Use CHG Soap or sage wipes as directed on instruction sheet   ____ Use inhalers on the day of surgery and bring to hospital day of surgery  ____ Stop Metformin and Janumet 2 days prior to surgery.    ____ Take 1/2 of usual insulin dose the night before surgery and none on the morning     surgery.   _x___ Follow recommendations from Cardiologist, Pulmonologist or PCP regarding          stopping Aspirin, Coumadin, Plavix ,Eliquis, Effient, or Pradaxa, and Pletal.  X____Stop Anti-inflammatories such as Advil, Aleve, Ibuprofen, Motrin, Naproxen, Naprosyn, Goodies powders or aspirin products. OK to take Tylenol and                          Celebrex.   _x___ Stop supplements until after surgery.  But may continue Vitamin D, Vitamin B,       and multivitamin.   ____ Bring C-Pap to the hospital.

## 2019-02-13 ENCOUNTER — Encounter: Payer: Self-pay | Admitting: Anesthesiology

## 2019-02-13 ENCOUNTER — Ambulatory Visit
Admission: RE | Admit: 2019-02-13 | Discharge: 2019-02-13 | Disposition: A | Discharge status: Home / Self Care | Payer: Medicare Other | Attending: Otolaryngology | Admitting: Otolaryngology

## 2019-02-13 ENCOUNTER — Encounter
Admission: RE | Disposition: A | Discharge status: Home / Self Care | Payer: Self-pay | Source: Home / Self Care | Attending: Otolaryngology

## 2019-02-13 ENCOUNTER — Ambulatory Visit: Payer: Self-pay | Admitting: Anesthesiology

## 2019-02-13 ENCOUNTER — Other Ambulatory Visit: Payer: Self-pay

## 2019-02-13 DIAGNOSIS — Z79899 Other long term (current) drug therapy: Secondary | ICD-10-CM | POA: Diagnosis not present

## 2019-02-13 DIAGNOSIS — Z96659 Presence of unspecified artificial knee joint: Secondary | ICD-10-CM | POA: Insufficient documentation

## 2019-02-13 DIAGNOSIS — D649 Anemia, unspecified: Secondary | ICD-10-CM | POA: Insufficient documentation

## 2019-02-13 DIAGNOSIS — L7212 Trichodermal cyst: Secondary | ICD-10-CM | POA: Insufficient documentation

## 2019-02-13 DIAGNOSIS — Z7901 Long term (current) use of anticoagulants: Secondary | ICD-10-CM | POA: Diagnosis not present

## 2019-02-13 DIAGNOSIS — I10 Essential (primary) hypertension: Secondary | ICD-10-CM | POA: Insufficient documentation

## 2019-02-13 DIAGNOSIS — L7211 Pilar cyst: Secondary | ICD-10-CM | POA: Diagnosis not present

## 2019-02-13 DIAGNOSIS — M1990 Unspecified osteoarthritis, unspecified site: Secondary | ICD-10-CM | POA: Diagnosis not present

## 2019-02-13 DIAGNOSIS — L989 Disorder of the skin and subcutaneous tissue, unspecified: Secondary | ICD-10-CM | POA: Diagnosis present

## 2019-02-13 HISTORY — DX: Unspecified diastolic (congestive) heart failure: I50.30

## 2019-02-13 HISTORY — DX: Presence of dental prosthetic device (complete) (partial): Z97.2

## 2019-02-13 HISTORY — DX: Nonrheumatic aortic (valve) stenosis: I35.0

## 2019-02-13 HISTORY — PX: EXCISION MASS HEAD: SHX6702

## 2019-02-13 HISTORY — DX: Dizziness and giddiness: R42

## 2019-02-13 HISTORY — DX: Dependence on other enabling machines and devices: Z99.89

## 2019-02-13 LAB — POCT I-STAT, CHEM 8
BUN: 10 mg/dL (ref 8–23)
Calcium, Ion: 1.29 mmol/L (ref 1.15–1.40)
Chloride: 101 mmol/L (ref 98–111)
Creatinine, Ser: 0.8 mg/dL (ref 0.44–1.00)
Glucose, Bld: 90 mg/dL (ref 70–99)
HCT: 37 % (ref 36.0–46.0)
Hemoglobin: 12.6 g/dL (ref 12.0–15.0)
Potassium: 3.5 mmol/L (ref 3.5–5.1)
Sodium: 139 mmol/L (ref 135–145)
TCO2: 26 mmol/L (ref 22–32)

## 2019-02-13 SURGERY — EXCISION, MASS, HEAD
Anesthesia: Monitor Anesthesia Care

## 2019-02-13 MED ORDER — PROPOFOL 10 MG/ML IV BOLUS
INTRAVENOUS | Status: DC | PRN
  Administered 2019-02-13: 30 mg via INTRAVENOUS
  Administered 2019-02-13: 20 mg via INTRAVENOUS

## 2019-02-13 MED ORDER — LIDOCAINE-EPINEPHRINE (PF) 1 %-1:200000 IJ SOLN
INTRAMUSCULAR | Status: DC | PRN
  Administered 2019-02-13: 7 mL

## 2019-02-13 MED ORDER — LACTATED RINGERS IV SOLN
INTRAVENOUS | Status: DC
  Administered 2019-02-13: 09:00:00 via INTRAVENOUS

## 2019-02-13 MED ORDER — FAMOTIDINE 20 MG PO TABS
ORAL_TABLET | ORAL | Status: AC
  Filled 2019-02-13: qty 1

## 2019-02-13 MED ORDER — HYDROCODONE-ACETAMINOPHEN 5-325 MG PO TABS: 1.0000 | ORAL_TABLET | Freq: Four times a day (QID) | ORAL | 0 refills | Status: AC | PRN

## 2019-02-13 MED ORDER — FAMOTIDINE 20 MG PO TABS
20.0000 mg | ORAL_TABLET | Freq: Once | ORAL | Status: AC
  Administered 2019-02-13: 20 mg via ORAL

## 2019-02-13 MED ORDER — PROPOFOL 10 MG/ML IV BOLUS
INTRAVENOUS | Status: AC
  Filled 2019-02-13: qty 20

## 2019-02-13 SURGICAL SUPPLY — 19 items
BLADE SURG 15 STRL LF DISP TIS (BLADE) ×1 IMPLANT
BLADE SURG 15 STRL SS (BLADE) ×2
CANISTER SUCT 1200ML W/VALVE (MISCELLANEOUS) ×3 IMPLANT
COVER WAND RF STERILE (DRAPES) ×3 IMPLANT
ELECT CAUTERY NEEDLE TIP 1.0 (MISCELLANEOUS) ×3
ELECT REM PT RETURN 9FT ADLT (ELECTROSURGICAL) ×3
ELECTRODE CAUTERY NEDL TIP 1.0 (MISCELLANEOUS) ×1 IMPLANT
ELECTRODE REM PT RTRN 9FT ADLT (ELECTROSURGICAL) ×1 IMPLANT
GLOVE PROTEXIS LATEX SZ 7.5 (GLOVE) ×3 IMPLANT
GLOVE SURG LATEX 7.5 PF (GLOVE) ×1 IMPLANT
GOWN STRL REUS W/ TWL LRG LVL3 (GOWN DISPOSABLE) ×2 IMPLANT
GOWN STRL REUS W/TWL LRG LVL3 (GOWN DISPOSABLE) ×4
KIT TURNOVER KIT A (KITS) ×3 IMPLANT
LABEL OR SOLS (LABEL) ×3 IMPLANT
PACK HEAD/NECK (MISCELLANEOUS) ×3 IMPLANT
SUT ETHILON 3-0 FS-10 30 BLK (SUTURE) ×3
SUT VIC AB 3-0 SH 27 (SUTURE) ×2
SUT VIC AB 3-0 SH 27X BRD (SUTURE) IMPLANT
SUTURE EHLN 3-0 FS-10 30 BLK (SUTURE) IMPLANT

## 2019-02-13 NOTE — Op Note (Signed)
02/13/2019  10:35 AM    [name] [name]  TC:[id number]   Pre-Op Dx: Anterior scalp lesion  Post-op Dx: Anterior scalp lesion  Proc: Excision anterior scalp lesion with advancement flap closure  Surg:  Elon Alas Dominik Yordy  Anes:  GOT  EBL: 5 mL  Comp: None  Findings: Raised scalp lesion about 1 inch behind the anterior hairline.  The lesion was about 17 mm in diameter.  Ellipse was marked around it in a horizontal fashion.  Procedure: Patient was brought to the operating room and placed in a beachchair position.  She was given very mild IV sedation while local anesthesia was placed.  The scalp was prepped with alcohol first and then 7 mL of 1% Xylocaine with epi 1-100,000 was infiltrated into the skin and subcu around the skin lesion.  Once the local was placed the area was then prepped with Betadine.  An elliptical incision was created around the lesion in a horizontal fashion to allow better advancement flap closure.  The incision was carried down through the dermis and epidermis and subcu tissue to the aponeurotic layer.  Full-thickness of scalp then was removed leaving the periosteum intact.  This was sharply excised all the way around and the entire specimen was removed and sent for permanent section.  There is small amount of bleeding from the posterior border of the dermis.  This was controlled electrocautery.  The aponeurotic layer was dissected to elevate the skin anterior and posterior to the wound and allow for advancement of the tissue.  This allowed closure of the wound primarily.. The wound was then sutured using 3-0 Vicryls for deep dermal sutures and then 3 oh nylons were used to close the wound with a running locking suture.  The entire width of the wound was approximately 2-1/2 cm.  The patient tolerated procedure well.  She was taken to the recovery room in satisfactory condition.  There were no operative complications.  Dispo:   To PACU to be discharged home  Plan: To  follow-up in the office in 10 to 14 days for suture removal.  I have discussed with the patient and her daughter regarding pain medication and have written for 6 Norco pain pills to be used if significant pain, but to use Tylenol otherwise.  She can restart her blood thinners tomorrow.  She will use a small amount of ointment over the wound edge as it is in the hairline and there is no way to put a bandage over it.  Elon Alas Mekisha Bittel  02/13/2019 10:35 AM

## 2019-02-13 NOTE — Anesthesia Post-op Follow-up Note (Signed)
Anesthesia QCDR form completed.        

## 2019-02-13 NOTE — Transfer of Care (Signed)
Immediate Anesthesia Transfer of Care Note  Patient: [name] [name]  Procedure(s) Performed: EXCISION of scalp leison (N/A )  Patient Location: PACU  Anesthesia Type:MAC  Level of Consciousness: awake, alert  and oriented  Airway & Oxygen Therapy: Patient Spontanous Breathing  Post-op Assessment: Post -op Vital signs reviewed and stable  Post vital signs: stable  Last Vitals:  Vitals Value Taken Time  BP 151/72 02/13/19 1040  Temp 36.2 C 02/13/19 1040  Pulse 88 02/13/19 1042  Resp 16 02/13/19 1042  SpO2 99 % 02/13/19 1042  Vitals shown include unvalidated device data.  Last Pain:  Vitals:   02/13/19 0836  TempSrc: Tympanic  PainSc: 0-No pain         Complications: No apparent anesthesia complications

## 2019-02-13 NOTE — H&P (Signed)
H&P has been reviewed and patient reevaluated, no changes necessary. To be downloaded later.  

## 2019-02-13 NOTE — Anesthesia Preprocedure Evaluation (Addendum)
Anesthesia Evaluation  Patient identified by MRN, date of birth, ID band Patient awake    Reviewed: Allergy & Precautions, H&P , NPO status , reviewed documented beta blocker date and time   Airway Mallampati: II  TM Distance: >3 FB Neck ROM: limited    Dental  (+) Lower Dentures   Pulmonary    Pulmonary exam normal        Cardiovascular hypertension, + Valvular Problems/Murmurs AS  + Systolic murmurs 2761 ECHO INTERPRETATION NORMAL LEFT VENTRICULAR SYSTOLIC FUNCTION  WITH MODERATE LVH NORMAL RIGHT VENTRICULAR SYSTOLIC FUNCTION SEVERE VALVULAR REGURGITATION (See above) SEVERE VALVULAR STENOSIS (See above) AVA(VTI)=.55cm^2 MODERATE MR SEVERE TR MODERATE PHTN MILD PR EF 50-55%   Neuro/Psych    GI/Hepatic neg GERD  ,  Endo/Other    Renal/GU      Musculoskeletal  (+) Arthritis ,   Abdominal   Peds  Hematology  (+) Blood dyscrasia, anemia ,   Anesthesia Other Findings Past Medical History: No date: Anemia No date: Aortic valve stenosis No date: Arthritis No date: Atrial fibrillation (HCC) No date: Breast cancer ([facility])     Comment:  lt breast ca 1989 lumpectomy No date: Gout No date: Heart failure with preserved ejection fraction (HCC) No date: Hyperlipemia No date: Hypertension No date: Osteoporosis No date: Personal history of radiation therapy No date: Uses walker     Comment:  or cane No date: Valvular heart disease No date: Vertigo     Comment:  none for several years No date: Wears dentures     Comment:  full lower  Past Surgical History: 1989: BREAST LUMPECTOMY; Left No date: BREAST SURGERY No date: EYE SURGERY 01/1996: JOINT REPLACEMENT; Right     Comment:  knee  BMI    Body Mass Index: 28.00 kg/m      Reproductive/Obstetrics                            Anesthesia Physical Anesthesia Plan  ASA: IV  Anesthesia Plan: MAC   Post-op Pain Management:     Induction: Intravenous  PONV Risk Score and Plan: 2 and Treatment may vary due to age or medical condition and TIVA  Airway Management Planned: Nasal Cannula and Natural Airway  Additional Equipment:   Intra-op Plan:   Post-operative Plan:   Informed Consent: I have reviewed the patients History and Physical, chart, labs and discussed the procedure including the risks, benefits and alternatives for the proposed anesthesia with the patient or authorized representative who has indicated his/her understanding and acceptance.     Dental Advisory Given  Plan Discussed with: CRNA  Anesthesia Plan Comments:        Anesthesia Quick Evaluation

## 2019-02-13 NOTE — Discharge Instructions (Signed)
AMBULATORY SURGERY  DISCHARGE INSTRUCTIONS   1) The drugs that you were given will stay in your system until tomorrow so for the next 24 hours you should not:  A) Drive an automobile B) Make any legal decisions C) Drink any alcoholic beverage   2) You may resume regular meals tomorrow.  Today it is better to start with liquids and gradually work up to solid foods.  You may eat anything you prefer, but it is better to start with liquids, then soup and crackers, and gradually work up to solid foods.   3) Please notify your doctor immediately if you have any unusual bleeding, trouble breathing, redness and pain at the surgery site, drainage, fever, or pain not relieved by medication.    4) Additional Instructions:        Please contact your physician with any problems or Same Day Surgery at [phone], Monday through Friday 6 am to 4 pm, or [place] at [facility] number at [phone].General Anesthesia, Adult, Care After This sheet gives you information about how to care for yourself after your procedure. Your health care provider may also give you more specific instructions. If you have problems or questions, contact your health care provider. What can I expect after the procedure? After the procedure, the following side effects are common:  Pain or discomfort at the IV site.  Nausea.  Vomiting.  Sore throat.  Trouble concentrating.  Feeling cold or chills.  Weak or tired.  Sleepiness and fatigue.  Soreness and body aches. These side effects can affect parts of the body that were not involved in surgery. Follow these instructions at home:  For at least 24 hours after the procedure:  Have a responsible adult stay with you. It is important to have someone help care for you until you are awake and alert.  Rest as needed.  Do not: ? Participate in activities in which you could fall or become injured. ? Drive. ? Use heavy machinery. ? Drink  alcohol. ? Take sleeping pills or medicines that cause drowsiness. ? Make important decisions or sign legal documents. ? Take care of children on your own. Eating and drinking  Follow any instructions from your health care provider about eating or drinking restrictions.  When you feel hungry, start by eating small amounts of foods that are soft and easy to digest (bland), such as toast. Gradually return to your regular diet.  Drink enough fluid to keep your urine pale yellow.  If you vomit, rehydrate by drinking water, juice, or clear broth. General instructions  If you have sleep apnea, surgery and certain medicines can increase your risk for breathing problems. Follow instructions from your health care provider about wearing your sleep device: ? Anytime you are sleeping, including during daytime naps. ? While taking prescription pain medicines, sleeping medicines, or medicines that make you drowsy.  Return to your normal activities as told by your health care provider. Ask your health care provider what activities are safe for you.  Take over-the-counter and prescription medicines only as told by your health care provider.  If you smoke, do not smoke without supervision.  Keep all follow-up visits as told by your health care provider. This is important. Contact a health care provider if:  You have nausea or vomiting that does not get better with medicine.  You cannot eat or drink without vomiting.  You have pain that does not get better with medicine.  You are unable to pass urine.  You  develop a skin rash.  You have a fever.  You have redness around your IV site that gets worse. Get help right away if:  You have difficulty breathing.  You have chest pain.  You have blood in your urine or stool, or you vomit blood. Summary  After the procedure, it is common to have a sore throat or nausea. It is also common to feel tired.  Have a responsible adult stay with you  for the first 24 hours after general anesthesia. It is important to have someone help care for you until you are awake and alert.  When you feel hungry, start by eating small amounts of foods that are soft and easy to digest (bland), such as toast. Gradually return to your regular diet.  Drink enough fluid to keep your urine pale yellow.  Return to your normal activities as told by your health care provider. Ask your health care provider what activities are safe for you. This information is not intended to replace advice given to you by your health care provider. Make sure you discuss any questions you have with your health care provider. Document Released: 07/18/2000 Document Revised: 04/14/2017 Document Reviewed: 11/25/2016 Elsevier Patient Education  2020 [employer].

## 2019-02-14 ENCOUNTER — Encounter: Payer: Self-pay | Admitting: Otolaryngology

## 2019-02-14 LAB — SURGICAL PATHOLOGY

## 2019-02-14 NOTE — Anesthesia Postprocedure Evaluation (Signed)
Anesthesia Post Note  Patient: [name] [name]  Procedure(s) Performed: EXCISION of scalp leison (N/A )  Patient location during evaluation: PACU Anesthesia Type: MAC Level of consciousness: awake and alert Pain management: pain level controlled Vital Signs Assessment: post-procedure vital signs reviewed and stable Respiratory status: spontaneous breathing, nonlabored ventilation and respiratory function stable Cardiovascular status: stable and blood pressure returned to baseline Postop Assessment: no apparent nausea or vomiting Anesthetic complications: no     Last Vitals:  Vitals:   02/13/19 1109 02/13/19 1117  BP: (!) 148/68 (!) 151/87  Pulse: 72 74  Resp: (!) 25 18  Temp:  (!) 36.3 C  SpO2: 97%     Last Pain:  Vitals:   02/13/19 1117  TempSrc: Tympanic  PainSc: 0-No pain                 Alphonsus Sias

## 2022-02-01 ENCOUNTER — Other Ambulatory Visit: Payer: Self-pay | Admitting: Gerontology

## 2022-02-01 DIAGNOSIS — M4856XA Collapsed vertebra, not elsewhere classified, lumbar region, initial encounter for fracture: Secondary | ICD-10-CM

## 2022-02-01 DIAGNOSIS — M546 Pain in thoracic spine: Secondary | ICD-10-CM

## 2022-02-01 DIAGNOSIS — S22000A Wedge compression fracture of unspecified thoracic vertebra, initial encounter for closed fracture: Secondary | ICD-10-CM

## 2022-02-18 ENCOUNTER — Inpatient Hospital Stay: Admission: RE | Admit: 2022-02-18 | Payer: Medicare Other | Source: Ambulatory Visit

## 2023-02-24 DEATH — deceased
